# Patient Record
Sex: Male | Born: 1995
Health system: Southern US, Community
[De-identification: ages and names within clinical notes are randomized; demographics above are authoritative.]

## PROBLEM LIST (undated history)

## (undated) DIAGNOSIS — E669 Obesity, unspecified: Secondary | ICD-10-CM

---

## 2020-06-25 ENCOUNTER — Emergency Department (HOSPITAL_COMMUNITY): Payer: 59

## 2020-06-25 ENCOUNTER — Inpatient Hospital Stay (HOSPITAL_COMMUNITY)
Admission: EM | Admit: 2020-06-25 | Discharge: 2020-07-03 | DRG: 917 | Disposition: A | Discharge status: Home / Self Care | Payer: 59 | Attending: Family Medicine | Admitting: Family Medicine

## 2020-06-25 ENCOUNTER — Other Ambulatory Visit: Payer: Self-pay

## 2020-06-25 ENCOUNTER — Inpatient Hospital Stay (HOSPITAL_COMMUNITY): Payer: 59

## 2020-06-25 ENCOUNTER — Encounter (HOSPITAL_COMMUNITY): Payer: Self-pay

## 2020-06-25 DIAGNOSIS — M6282 Rhabdomyolysis: Secondary | ICD-10-CM | POA: Diagnosis not present

## 2020-06-25 DIAGNOSIS — F39 Unspecified mood [affective] disorder: Secondary | ICD-10-CM | POA: Diagnosis not present

## 2020-06-25 DIAGNOSIS — F319 Bipolar disorder, unspecified: Secondary | ICD-10-CM | POA: Diagnosis present

## 2020-06-25 DIAGNOSIS — R7989 Other specified abnormal findings of blood chemistry: Secondary | ICD-10-CM | POA: Diagnosis present

## 2020-06-25 DIAGNOSIS — F419 Anxiety disorder, unspecified: Secondary | ICD-10-CM | POA: Diagnosis present

## 2020-06-25 DIAGNOSIS — F12151 Cannabis abuse with psychotic disorder with hallucinations: Secondary | ICD-10-CM | POA: Diagnosis present

## 2020-06-25 DIAGNOSIS — R404 Transient alteration of awareness: Secondary | ICD-10-CM

## 2020-06-25 DIAGNOSIS — Z538 Procedure and treatment not carried out for other reasons: Secondary | ICD-10-CM | POA: Diagnosis not present

## 2020-06-25 DIAGNOSIS — D72829 Elevated white blood cell count, unspecified: Secondary | ICD-10-CM | POA: Diagnosis present

## 2020-06-25 DIAGNOSIS — Y929 Unspecified place or not applicable: Secondary | ICD-10-CM

## 2020-06-25 DIAGNOSIS — F12988 Cannabis use, unspecified with other cannabis-induced disorder: Secondary | ICD-10-CM | POA: Diagnosis present

## 2020-06-25 DIAGNOSIS — R000 Tachycardia, unspecified: Secondary | ICD-10-CM | POA: Diagnosis present

## 2020-06-25 DIAGNOSIS — T40711A Poisoning by cannabis, accidental (unintentional), initial encounter: Principal | ICD-10-CM | POA: Diagnosis present

## 2020-06-25 DIAGNOSIS — Z818 Family history of other mental and behavioral disorders: Secondary | ICD-10-CM

## 2020-06-25 DIAGNOSIS — R9431 Abnormal electrocardiogram [ECG] [EKG]: Secondary | ICD-10-CM | POA: Diagnosis present

## 2020-06-25 DIAGNOSIS — E860 Dehydration: Secondary | ICD-10-CM | POA: Diagnosis present

## 2020-06-25 DIAGNOSIS — F909 Attention-deficit hyperactivity disorder, unspecified type: Secondary | ICD-10-CM | POA: Diagnosis present

## 2020-06-25 DIAGNOSIS — E876 Hypokalemia: Secondary | ICD-10-CM | POA: Diagnosis present

## 2020-06-25 DIAGNOSIS — R4182 Altered mental status, unspecified: Secondary | ICD-10-CM | POA: Diagnosis not present

## 2020-06-25 DIAGNOSIS — E669 Obesity, unspecified: Secondary | ICD-10-CM | POA: Diagnosis present

## 2020-06-25 DIAGNOSIS — G928 Other toxic encephalopathy: Secondary | ICD-10-CM | POA: Diagnosis present

## 2020-06-25 DIAGNOSIS — Z6839 Body mass index (BMI) 39.0-39.9, adult: Secondary | ICD-10-CM | POA: Diagnosis not present

## 2020-06-25 DIAGNOSIS — U070 Vaping-related disorder: Secondary | ICD-10-CM | POA: Diagnosis present

## 2020-06-25 DIAGNOSIS — Z20822 Contact with and (suspected) exposure to covid-19: Secondary | ICD-10-CM | POA: Diagnosis present

## 2020-06-25 DIAGNOSIS — F1514 Other stimulant abuse with stimulant-induced mood disorder: Secondary | ICD-10-CM | POA: Diagnosis present

## 2020-06-25 DIAGNOSIS — G0490 Encephalitis and encephalomyelitis, unspecified: Secondary | ICD-10-CM | POA: Diagnosis not present

## 2020-06-25 DIAGNOSIS — R748 Abnormal levels of other serum enzymes: Secondary | ICD-10-CM | POA: Diagnosis present

## 2020-06-25 DIAGNOSIS — Z781 Physical restraint status: Secondary | ICD-10-CM | POA: Diagnosis not present

## 2020-06-25 DIAGNOSIS — A419 Sepsis, unspecified organism: Secondary | ICD-10-CM | POA: Insufficient documentation

## 2020-06-25 DIAGNOSIS — R410 Disorientation, unspecified: Secondary | ICD-10-CM | POA: Diagnosis not present

## 2020-06-25 HISTORY — DX: Obesity, unspecified: E66.9

## 2020-06-25 LAB — ACETAMINOPHEN LEVEL: Acetaminophen (Tylenol), Serum: <10 ug/mL — ABNORMAL LOW (ref 10–30)

## 2020-06-25 LAB — APTT: aPTT: 34 seconds (ref 24–36)

## 2020-06-25 LAB — BLOOD GAS, VENOUS
Acid-base deficit: 2.9 mmol/L — ABNORMAL HIGH (ref 0.0–2.0)
Bicarbonate: 21.3 mmol/L (ref 20.0–28.0)
Drawn by: 164
O2 Saturation: 96.5 %
Patient temperature: 37
pCO2, Ven: 36.2 mmHg — ABNORMAL LOW (ref 44.0–60.0)
pH, Ven: 7.387 (ref 7.250–7.430)
pO2, Ven: 88.5 mmHg — ABNORMAL HIGH (ref 32.0–45.0)

## 2020-06-25 LAB — TSH
TSH: 2.31 u[IU]/mL (ref 0.350–4.500)
TSH: 2.72 u[IU]/mL (ref 0.350–4.500)

## 2020-06-25 LAB — URINALYSIS, COMPLETE (UACMP) WITH MICROSCOPIC
Glucose, UA: NEGATIVE mg/dL
Ketones, ur: >80 mg/dL — AB
Leukocytes,Ua: NEGATIVE
Nitrite: NEGATIVE
Protein, ur: 100 mg/dL — AB
Specific Gravity, Urine: >1.03 — ABNORMAL HIGH (ref 1.005–1.030)
Squamous Epithelial / HPF: NONE SEEN (ref 0–5)
pH: 6 (ref 5.0–8.0)

## 2020-06-25 LAB — COMPREHENSIVE METABOLIC PANEL
ALT: 25 U/L (ref 0–44)
AST: 32 U/L (ref 15–41)
Albumin: 4.8 g/dL (ref 3.5–5.0)
Alkaline Phosphatase: 81 U/L (ref 38–126)
Anion gap: 17 — ABNORMAL HIGH (ref 5–15)
BUN: 10 mg/dL (ref 6–20)
CO2: 17 mmol/L — ABNORMAL LOW (ref 22–32)
Calcium: 9.5 mg/dL (ref 8.9–10.3)
Chloride: 102 mmol/L (ref 98–111)
Creatinine, Ser: 0.99 mg/dL (ref 0.61–1.24)
GFR, Estimated: >60 mL/min (ref >60)
Glucose, Bld: 88 mg/dL (ref 70–99)
Potassium: 3.3 mmol/L — ABNORMAL LOW (ref 3.5–5.1)
Sodium: 136 mmol/L (ref 135–145)
Total Bilirubin: 1.4 mg/dL — ABNORMAL HIGH (ref 0.3–1.2)
Total Protein: 8.6 g/dL — ABNORMAL HIGH (ref 6.5–8.1)

## 2020-06-25 LAB — I-STAT VENOUS BLOOD GAS, ED
Acid-base deficit: 3 mmol/L — ABNORMAL HIGH (ref 0.0–2.0)
Bicarbonate: 19.3 mmol/L — ABNORMAL LOW (ref 20.0–28.0)
Calcium, Ion: 1.07 mmol/L — ABNORMAL LOW (ref 1.15–1.40)
HCT: 48 % (ref 39.0–52.0)
Hemoglobin: 16.3 g/dL (ref 13.0–17.0)
O2 Saturation: 97 %
Potassium: 3.6 mmol/L (ref 3.5–5.1)
Sodium: 139 mmol/L (ref 135–145)
TCO2: 20 mmol/L — ABNORMAL LOW (ref 22–32)
pCO2, Ven: 28.3 mmHg — ABNORMAL LOW (ref 44.0–60.0)
pH, Ven: 7.443 — ABNORMAL HIGH (ref 7.250–7.430)
pO2, Ven: 89 mmHg — ABNORMAL HIGH (ref 32.0–45.0)

## 2020-06-25 LAB — PROTIME-INR
INR: 1.1 (ref 0.8–1.2)
Prothrombin Time: 13.7 seconds (ref 11.4–15.2)

## 2020-06-25 LAB — RAPID URINE DRUG SCREEN, HOSP PERFORMED
Amphetamines: NOT DETECTED
Barbiturates: NOT DETECTED
Benzodiazepines: NOT DETECTED
Cocaine: NOT DETECTED
Opiates: NOT DETECTED
Tetrahydrocannabinol: POSITIVE — AB

## 2020-06-25 LAB — BASIC METABOLIC PANEL
Anion gap: 9 (ref 5–15)
BUN: 8 mg/dL (ref 6–20)
CO2: 24 mmol/L (ref 22–32)
Calcium: 8.8 mg/dL — ABNORMAL LOW (ref 8.9–10.3)
Chloride: 104 mmol/L (ref 98–111)
Creatinine, Ser: 0.77 mg/dL (ref 0.61–1.24)
GFR, Estimated: >60 mL/min (ref >60)
Glucose, Bld: 104 mg/dL — ABNORMAL HIGH (ref 70–99)
Potassium: 3.3 mmol/L — ABNORMAL LOW (ref 3.5–5.1)
Sodium: 137 mmol/L (ref 135–145)

## 2020-06-25 LAB — RESP PANEL BY RT-PCR (FLU A&B, COVID) ARPGX2
Influenza A by PCR: NEGATIVE
Influenza B by PCR: NEGATIVE
SARS Coronavirus 2 by RT PCR: NEGATIVE

## 2020-06-25 LAB — CBC
HCT: 47.9 % (ref 39.0–52.0)
Hemoglobin: 16 g/dL (ref 13.0–17.0)
MCH: 29.1 pg (ref 26.0–34.0)
MCHC: 33.4 g/dL (ref 30.0–36.0)
MCV: 87.2 fL (ref 80.0–100.0)
Platelets: 399 10*3/uL (ref 150–400)
RBC: 5.49 MIL/uL (ref 4.22–5.81)
RDW: 12.2 % (ref 11.5–15.5)
WBC: 15.4 10*3/uL — ABNORMAL HIGH (ref 4.0–10.5)
nRBC: 0 % (ref 0.0–0.2)

## 2020-06-25 LAB — CK: Total CK: 1130 U/L — ABNORMAL HIGH (ref 49–397)

## 2020-06-25 LAB — ETHANOL
Alcohol, Ethyl (B): <10 mg/dL (ref <10)
Alcohol, Ethyl (B): <10 mg/dL (ref <10)

## 2020-06-25 LAB — SALICYLATE LEVEL: Salicylate Lvl: <7 mg/dL — ABNORMAL LOW (ref 7.0–30.0)

## 2020-06-25 LAB — LACTIC ACID, PLASMA
Lactic Acid, Venous: 2.1 mmol/L (ref 0.5–1.9)
Lactic Acid, Venous: 0.9 mmol/L (ref 0.5–1.9)

## 2020-06-25 LAB — AMMONIA: Ammonia: 20 umol/L (ref 9–35)

## 2020-06-25 LAB — CBG MONITORING, ED: Glucose-Capillary: 105 mg/dL — ABNORMAL HIGH (ref 70–99)

## 2020-06-25 LAB — HIV ANTIBODY (ROUTINE TESTING W REFLEX): HIV Screen 4th Generation wRfx: NONREACTIVE

## 2020-06-25 MED ORDER — LACTATED RINGERS IV BOLUS
500.0000 mL | Freq: Once | INTRAVENOUS | Status: AC
  Administered 2020-06-25: 500 mL via INTRAVENOUS

## 2020-06-25 MED ORDER — ACETAMINOPHEN 650 MG RE SUPP: 650.0000 mg | Freq: Four times a day (QID) | RECTAL | Status: DC | PRN

## 2020-06-25 MED ORDER — SODIUM CHLORIDE 0.9 % IV SOLN: INTRAVENOUS | Status: DC

## 2020-06-25 MED ORDER — LORAZEPAM 2 MG/ML IJ SOLN: 0.5000 mg | INTRAMUSCULAR | Status: DC | PRN

## 2020-06-25 MED ORDER — LACTATED RINGERS IV BOLUS
1000.0000 mL | Freq: Once | INTRAVENOUS | Status: AC
  Administered 2020-06-25: 1000 mL via INTRAVENOUS

## 2020-06-25 MED ORDER — MIDAZOLAM HCL 2 MG/2ML IJ SOLN
5.0000 mg | INTRAMUSCULAR | Status: DC | PRN
  Administered 2020-06-25: 5 mg via INTRAVENOUS
  Filled 2020-06-25: qty 6

## 2020-06-25 MED ORDER — MIDAZOLAM HCL 2 MG/2ML IJ SOLN: 2.0000 mg | INTRAMUSCULAR | Status: DC | PRN

## 2020-06-25 MED ORDER — ZIPRASIDONE MESYLATE 20 MG IM SOLR
20.0000 mg | Freq: Once | INTRAMUSCULAR | Status: AC
  Administered 2020-06-25: 20 mg via INTRAMUSCULAR
  Filled 2020-06-25: qty 20

## 2020-06-25 MED ORDER — ACETAMINOPHEN 325 MG PO TABS
650.0000 mg | ORAL_TABLET | Freq: Four times a day (QID) | ORAL | Status: DC | PRN
  Administered 2020-07-02: 650 mg via ORAL
  Filled 2020-06-25: qty 2

## 2020-06-25 MED ORDER — LORAZEPAM 2 MG/ML IJ SOLN
2.0000 mg | Freq: Once | INTRAMUSCULAR | Status: AC
  Administered 2020-06-25: 2 mg via INTRAMUSCULAR
  Filled 2020-06-25: qty 1

## 2020-06-25 MED ORDER — SODIUM CHLORIDE 0.9 % IV SOLN
2.0000 g | Freq: Two times a day (BID) | INTRAVENOUS | Status: DC
  Administered 2020-06-25 – 2020-06-26 (×2): 2 g via INTRAVENOUS
  Filled 2020-06-25: qty 20
  Filled 2020-06-25: qty 2
  Filled 2020-06-25: qty 20

## 2020-06-25 MED ORDER — ACETAMINOPHEN 500 MG PO TABS
1000.0000 mg | ORAL_TABLET | Freq: Once | ORAL | Status: AC
  Administered 2020-06-25: 1000 mg via ORAL
  Filled 2020-06-25: qty 2

## 2020-06-25 MED ORDER — DIPHENHYDRAMINE HCL 50 MG/ML IJ SOLN
25.0000 mg | Freq: Once | INTRAMUSCULAR | Status: AC
  Administered 2020-06-25: 25 mg via INTRAMUSCULAR
  Filled 2020-06-25: qty 1

## 2020-06-25 MED ORDER — LORAZEPAM 2 MG/ML IJ SOLN
1.0000 mg | INTRAMUSCULAR | Status: DC | PRN
  Administered 2020-06-26: 1 mg via INTRAVENOUS
  Filled 2020-06-25: qty 1

## 2020-06-25 MED ORDER — SODIUM CHLORIDE 0.9 % IV BOLUS: 1000.0000 mL | Freq: Once | INTRAVENOUS | Status: DC

## 2020-06-25 MED ORDER — VANCOMYCIN HCL 1250 MG/250ML IV SOLN
1250.0000 mg | Freq: Three times a day (TID) | INTRAVENOUS | Status: DC
  Administered 2020-06-25 – 2020-06-26 (×3): 1250 mg via INTRAVENOUS
  Filled 2020-06-25 (×5): qty 250

## 2020-06-25 MED ORDER — VANCOMYCIN HCL 2000 MG/400ML IV SOLN
2000.0000 mg | Freq: Once | INTRAVENOUS | Status: DC
  Filled 2020-06-25: qty 400

## 2020-06-25 MED ORDER — HALOPERIDOL LACTATE 5 MG/ML IJ SOLN
5.0000 mg | Freq: Once | INTRAMUSCULAR | Status: AC
  Administered 2020-06-25: 5 mg via INTRAMUSCULAR
  Filled 2020-06-25: qty 1

## 2020-06-25 MED ORDER — DEXTROSE 5 % IV SOLN
10.0000 mg/kg | Freq: Three times a day (TID) | INTRAVENOUS | Status: DC
  Administered 2020-06-25 – 2020-06-26 (×2): 800 mg via INTRAVENOUS
  Filled 2020-06-25 (×5): qty 16

## 2020-06-25 MED ORDER — LORAZEPAM 1 MG PO TABS
1.0000 mg | ORAL_TABLET | Freq: Once | ORAL | Status: AC
  Administered 2020-06-25: 1 mg via ORAL
  Filled 2020-06-25: qty 1

## 2020-06-25 MED ORDER — SODIUM CHLORIDE 0.9 % IV SOLN
2.0000 g | Freq: Once | INTRAVENOUS | Status: AC
  Administered 2020-06-25: 2 g via INTRAVENOUS
  Filled 2020-06-25: qty 20

## 2020-06-25 NOTE — ED Notes (Signed)
Report Olegario Messier, RN of [phone]

## 2020-06-25 NOTE — ED Notes (Signed)
Pt has to be redirected several times in order to follow commands.

## 2020-06-25 NOTE — Progress Notes (Signed)
Will attempt EEG after MRI

## 2020-06-25 NOTE — Progress Notes (Signed)
Interim progress note:  Paged by RN that parents were at bedside and would like an update. Dr. Melba Coon and I discussed the current labs/urine results and CT head.  Reported that they were already aware the LP was not able to be obtained.  Discussed proceeding with plan as outlined in H&P including MRI brain, EEG, antibiotics for now, neurology consultation, and psychiatry evaluation tomorrow.  Discussed broad differential at this time including for infection (less likely), drug-induced psychosis, vs organic initial manifestation of psychiatric illness with psychosis.  Dad does report that the patient's girlfriend stated he has not slept or ate much of anything in the past 3 days and was constantly working on his website.  Parents also very relieved that he almost was acting like his regular self when they are having a normal conversation with him earlier tonight (after receiving Geodon/Haldol/Ativan in ED).  Dad reports the only thing that was different than his regular was during conversation every once while he would seem like he heard something ("like an alarm or something") and would be agitated for a few minutes and then come back to a regular conversation.  He was able to remember some bits of earlier today and apologized for being violent.  Parents state he was hyper-focused on "solving the puzzle" of his current condition.  Patient was resting comfortably during the entirety of our conversation.  All questions were answered.  We will update parents overnight if any clinical change, otherwise mom reports she will be back here at 7 AM.  Allayne Stack, DO

## 2020-06-25 NOTE — Consult Note (Signed)
Neurology Consultation  Reason for Consult: Altered mental status  Referring Physician: Dr. Manson Passey  CC: Altered mental status  History is obtained from: Chart review, patient  HPI: Donald Curry is a 25 y.o. male with no pertinent medical history who presented to the ED today after he was -brought by his father for concerns of agitation and aggression. His girlfriend noticed last night that he was acting strangely and called Donald Curry father for help when he went to take the dog for a walk in the early morning hours and returned home without the dog. Donald Curry continued acting confused and aggressive for his father so he was brought to the ED for further evaluation. In the ED, he was verbally aggressive with tachycardia, low grade temperature, with tachypnea and an elevated venous lactic acid. EDP was concerned for infectious process as cause of presentation. His UDS was also positive for THC. He required Geodon, Haldol, Benadryl, Ativan, and Versed in the ED for aggression and agitation. He was taken for fluoro-guided LP which was unsuccessful due to patient movements and contaminating the sterile field.   On examination today patient has disorganized thoughts and pressured speech with mood lability. Some grandiosity is noted as well. For example he references "having no weaknesses" and being "the biggest and smallest college and went to school for clowning everyone". He claims he is in college but finished Electrical engineer college" and got his PhD online "in everything" and then when asked the specific subject, proceeds to list "mathematics, chemistry, biology, and physics", then states he wants to see his phone to be able to tell examiners the rest of the subjects he obtained a PhD in. Patient also begins to talk about believing that "someone is cheating", stating that the identity is someone named "father" and when asked if it was his father or someone named father he states "yeah, I guess my father". He  then goes on to state that his father is cheating on his mother and later states that his father "plays on his cheating device" while mimicking someone tapping away at a smartphone. Also endorses talking to "false prophets" today and describes them as wearing doctor's clothing and masks, but they were "lying to me" which he sensed because they did not give him good eye contact. He endorses using a drug called "Delta-10" that he got from "some place called [place]" because it "keeps you up, makes you feel smart, and you are on autopilot and already know what is going to happen". Intermittently becomes agitated stating that him being "stuck in the hospital" is "my father's fault" but also states that he feels like he is "stuck in [employer]" when it is "sometimes my fault". He frequently becomes agitated with some of the questions asked He claims that "there is a bug that needs to be rectified". He has paranoid thoughts as well claiming that he has heard others speak behind his back about "his slowed thoughts" and states that he has "been beat up" with "people talking behind my back". Also endorses visual hallucinations during assessment pointing to the ceiling while stating that he is seeing a bug running on the ceiling. States that he has a website that nobody is supposed to know about and he thinks it is connected to another website that he is "not even allowed to access": [email or web address].    ROS: Unable to obtain due to altered mental status.   History reviewed. No pertinent past medical history.  History reviewed. No pertinent family history.  Social History:   reports current alcohol use. No history on file for tobacco use and drug use.  THC first use 25 years old. Claims to use "Delta-10".   Medications  Current Facility-Administered Medications:  .  acetaminophen (TYLENOL) tablet 650 mg, 650 mg, Oral, Q6H PRN **OR** acetaminophen (TYLENOL) suppository 650 mg, 650 mg, Rectal, Q6H PRN,  Idalia Needle, Victoria J, DO .  LORazepam (ATIVAN) injection 0.5 mg, 0.5 mg, Intravenous, PRN, Paige, Victoria J, DO .  vancomycin (VANCOREADY) IVPB 1250 mg/250 mL, 1,250 mg, Intravenous, Q8H, Paige, Victoria J, DO, Last Rate: 166.7 mL/hr at 06/25/20 1451, 1,250 mg at 06/25/20 1451 .  vancomycin (VANCOREADY) IVPB 2000 mg/400 mL, 2,000 mg, Intravenous, Once, Paige, Victoria J, DO  Exam: Current vital signs: BP 110/60   Pulse 99   Temp 100 F (37.8 C) (Oral)   Resp 20   Ht 6\' 1"  (1.854 m)   Wt 135.1 kg   SpO2 96%   BMI 39.29 kg/m  Vital signs in last 24 hours: Temp:  [100 F (37.8 C)] 100 F (37.8 C) (03/25 0703) Pulse Rate:  [91-150] 99 (03/25 1400) Resp:  [14-29] 20 (03/25 1400) BP: (98-155)/(46-106) 110/60 (03/25 1400) SpO2:  [83 %-100 %] 96 % (03/25 1400) Weight:  [135.1 kg] 135.1 kg (03/25 1030)  GENERAL: Sleeping in bed initially, wakes to voice. Appears anxious and somewhat agitated.  Psych: speech is rapid and pressured, thought content is delusional, thought process is disorganized. Not currently suicidal. Endorses thoughts/belilefs consistent with paranoia, as well as delusions of grandeur. Speech with flight of ideas and tangentiality as well as word salad. Endorses seeing a bug that is not there when examiners look to where he is pointing. Denies auditory hallucinations. Mood is agitated and labile.  Head: Normocephalic and atraumatic without obvious deformity EENT: No OP obstruction LUNGS: Increased respiratory rate, non-labored breathing CV - tachycardic on presentation ABDOMEN - Soft, non-distended Ext: warm, without obvious deformity  NEURO:  Mental Status: Alert to self and year. When asked the month he states "I would say 4" then looks at the communication board in the room and then corrects himself to "month 3". Correctly identifies the state, but not the city. Speech is pressured and rapid but without aphasia or dysarthria. Sometimes perseverates. Naming, fluency, and  comprehension intact.  No neglect noted.  Cranial Nerves:  II: Pupils appear equal and round.  III, IV, VI: EOMI. Fixates and tracks examiner around room.  V: Unable to assess facial sensation to light touch due to patient agitation.  VII: Face is symmetric resting and smiling  VIII: Hearing intact to voice IX, X: Phonation normal. Swallows water and crackers without difficulty XI: UTA due to patient agitation XII: Does not protrude tongue to command Motor: Spontaneous antigravity movement noted in all extremities. Bulk is normal. Too agitated and emotionally labile to safely test limb strength against resistance. Sensation: UTA due to patient agitation Reflexes: UTA due to patient agitation Cerebellar: No gross ataxia noted with spontaneous movements  Gait- Deferred  Labs I have reviewed labs in epic and the results pertinent to this consultation are:  CBC    Component Value Date/Time   WBC 15.4 (H) 06/25/2020 0651   RBC 5.49 06/25/2020 0651   HGB 16.3 06/25/2020 0955   HCT 48.0 06/25/2020 0955   PLT 399 06/25/2020 0651   MCV 87.2 06/25/2020 0651   MCH 29.1 06/25/2020 0651   MCHC 33.4 06/25/2020 0651   RDW 12.2 06/25/2020 0651   CMP  Component Value Date/Time   NA 139 06/25/2020 0955   K 3.6 06/25/2020 0955   CL 102 06/25/2020 0651   CO2 17 (L) 06/25/2020 0651   GLUCOSE 88 06/25/2020 0651   BUN 10 06/25/2020 0651   CREATININE 0.99 06/25/2020 0651   CALCIUM 9.5 06/25/2020 0651   PROT 8.6 (H) 06/25/2020 0651   ALBUMIN 4.8 06/25/2020 0651   AST 32 06/25/2020 0651   ALT 25 06/25/2020 0651   ALKPHOS 81 06/25/2020 0651   BILITOT 1.4 (H) 06/25/2020 0651   GFRNONAA >60 06/25/2020 5621   Imaging I have reviewed the images obtained:  CT-scan of the brain: IMPRESSION: No acute intracranial findings.  MRI examination of the brain ordered, pending  Assessment: 25 year old male presenting with acute alteration in mental status with agitation, flight of ideas,  paranoia, disorganized thoughts, and evidence for visual hallucinations and delusions. UDS positive for THC and also endorses use yesterday of a psychoactive substance named "Delta-10" which can apparently be bought over the counter at a gas station in [facility]. - Examination reveals patient without focal neurological deficits but with pressured speech, agitation, mood lability, flight of ideas, word salad, disorganized thinking, hallucinations and delusions despite multiple agents used for agitation in the ED today: Benadryl, Versed, Ativan, Haldol, and Geodon. - EDP concern for possible infectious cause of altered mental status due to tachycardia, tachypnea, and elevated venous lactate. Based on assessment by Neurology team, these symptoms are felt more likely to be due to serotonin syndrome.  - Unable to obtain fluoro-guided LP due to patient agitation and contamination of sterile field. - Patient's reported family history of psychiatric illness places him at increased risk for AMS secondary to organic psychiatric disorder. Per review, mother with a history of altered mental status, confusion, and agitation after reported lack of sleep by patient's father. Patient's father believes this is a similar presentation to his mother's presentation in the past. - DDx includes THC-induced psychosis versus serotonin syndrome, versus psychosis due to use of OTC psychoactive "Delta-10", versus bipolar disorder- manic episode with psychotic features, versus unspecified schizophrenia spectrum disorder. Low suspicion for meningitis, encephalitis, or other infectious causes at this time due to overall patient presentation and exam findings. No complaints of neck pain or stiffness on examination.  - Head CT without acute abnormality.   Recommendations: - Recommend clinical staff to visit the patient's personally designed website [email or web address] - Psychiatric evaluation - MRI brain when able to obtain  - No need  for LP at this time or continued empiric meningitis coverage- low suspicion for infectious etiology at this time - Sedating medications per Psychiatry - 10-panel drug screen to complement the basic panel already obtained  Lanae Boast, AGAC-NP [facility] Pager: [phone]  I have seen and examined the patient. I have reviewed and amended the assessment and recommendations. 25 year old male presenting with acute alteration in mental status with agitation, flight of ideas, paranoia, disorganized thoughts, and evidence for visual hallucinations and delusions. UDS positive for THC and also endorses use yesterday of a psychoactive substance named "Delta-10" which can apparently be bought over the counter at a gas station in [facility]. Exam findings most consistent with acute psychosis. Recommendations as above.  Electronically signed: Dr. Caryl Pina

## 2020-06-25 NOTE — ED Triage Notes (Signed)
Brought in accompanied by father - symptoms started yesterday - per father - pt is not making any sense, saying incoherent statements.   Upon arrival of pt in triage, pt unable to follow directions, agitated. Pt keeps on having flight of ideas and showing bursts of energy.   Unable to check vital signs on pt as he took bp cuff off. Unable to get further info from pt.

## 2020-06-25 NOTE — Consult Note (Signed)
Fluoro-guided LP requested. Consent obtained from patient's father via phone. Patient received 5mg  verced prior to procedure. Time out performed.  Unfortunately, procedure could not be performed due to inability of patient to remain still. Patient contaminated sterile field during attempted sterilization and moved multiple times after site marking with fluoro.  Procedure was aborted.  This was discussed with the referring clinican in the ED, PA.  -Allena Katz, MD

## 2020-06-25 NOTE — Progress Notes (Signed)
Pharmacy Antibiotic Note  Donald Curry is a 25 y.o. male admitted on 06/25/2020 with suspicion for meningitis. Pt with no psychiatric history presents with AMS and is febrile/tachycardic. Lactic acid 2.1, WBC 15.4. Given presentation, pt undergoing infectious workup including meningitis. Pharmacy has been consulted for vancomycin dosing.  Plan: Vancomycin 2000 mg loading dose x1 Vancomycin 1250 IV every 8 hours. Goal trough 15-20 mcg/mL. Trough levels at steady state. CTX per MD Follow up cultures, clinical progression, and plan of treatment.   Height: 6\' 1"  (185.4 cm) Weight: 135.1 kg (297 lb 12.8 oz) IBW/kg (Calculated) : 79.9  Temp (24hrs), Avg:100 F (37.8 C), Min:100 F (37.8 C), Max:100 F (37.8 C)  Recent Labs  Lab 06/25/20 0651 06/25/20 0945  WBC 15.4*  --   CREATININE 0.99  --   LATICACIDVEN  --  2.1*    Estimated Creatinine Clearance: 168.9 mL/min (by C-G formula based on SCr of 0.99 mg/dL).    No Known Allergies  Antimicrobials this admission: Vancomycin 3/25 >>  Ceftriaxone 3/25 >>   Dose adjustments this admission: N/A  Microbiology results: 3/25 BCx x2: pending 3/25 UCx: pending  3/25 CSF Culture: pending 3/25 RT-PCR: pending  Thank you for allowing pharmacy to be a part of this patient's care.  4/25 PharmD Candidate 2022 06/25/2020 11:30 AM

## 2020-06-25 NOTE — ED Notes (Signed)
Pt to IR at this time on transport monitor. SWOT RN to assist IR with LP.

## 2020-06-25 NOTE — H&P (Addendum)
Family Medicine Teaching [facility] Admission History and Physical Service Pager: [phone]  Patient name: Donald Curry Medical record number: [id number] Date of birth: May 22, 1995 Age: 25 y.o. Gender: male  Primary Care Provider: Patient, No Pcp Per Consultants: IR, Neuro Code Status: FULL Preferred Emergency Contact: Carols Clemence (father) [phone]  Chief Complaint: altered mental status  Assessment and Plan: Donald Curry is a 25 y.o. male presenting with altered mental status . PMH is significant for ADHD.   Altered mental status  Acute Encephalopathy  c/f Sepsis  Patient presented to ED with new onset confusion and agitation. He was tachycardic to 150, temp of 100, tachypnic to 27. On physical exam alert to name, but refused to answer other questions. Neuro exam wnl. Differential broad at this time and includes sepsis, encephalitis, meningitis, drug or alcohol intoxication, psychogenic psychosis, THC induced psychosis, seizure. At this time there is no clear source of infection, though patient meets SIRS criteria with tachycardia, tachypnea, and elevated WBC of 15 although WBC could potentially be due from demargination/stress reaction. LA elevated to 2.1. UA negative for infection. LP done in the ED and will await results to check for potential CNS infection. MRI ordered. Started patient on Ceftriaxone and Vancomycin. Obtaining an ethanol level to check for alcohol intoxication, CIWAs in place. We can not rule out epileptic seizure tho less likely but obtaining EEG.  With regards to drug intoxication, UDS was positive for THC which could cause Cannabis induced psychosis. THC could also potentially be laced with a drug such as PCP that is not detected on UDS. Could also potentially be new onset schizophrenia, as this is the age when men are most likely to present with it, and on exam he had delusions and disorganized thoughts. Also considered SS and NMS. Ruled out uremia due to  normal kidney function, and also ruled out trauma and tumor with negative CT head. We are most likely considering psychogenic psychosis, drug intoxication, or new onset schizophrenia, though we are still pending additional work up.  - admit patient to FPTS, attending Dr. Manson Passey - Consulted IR and neuro, appreciate recommendations  - consult psych  - vitals per floor - continuous pulse ox  - cardiac monitoring  - Con't Vanc and Ctx at meningitic dosing  - start valacyclovir also at meningitic dosing after LP - con't mIVF   - f/u urine culture, blood culture  - f/u MRI - f/u CSF cell count - f/u TSH, CK, HSV, HIV - f/u am BMP, CBC - f/u CIWA - call girlfriend about potential substances used   THC Use  UDS positive for THC. Unable to obtain from patient how much he uses - encourage cessation     ADHD Per chart review. No medications on chart.   FEN/GI: NPO pending bedside swallow eval Prophylaxis: SCDs   Disposition: Progressive  History of Present Illness:  Donald Curry is a 25 y.o. male presenting with confusion and agitation. Information obtained from ED notes, as patient was not answering our questions. Patient is somnolent, arouses to voice, oriented to self only. Did not answer which hospital he was in or what year it was, stating those were dumb questions. He proceeded to talk about how much he misses Baird Lyons (girlfriend).   Yesterday night patient started acting strange per father and girlfriend, acting confused and being aggressive.  He went to walk the dog early this morning and came home without the dog. Denies any flight of ideas or burst of energy.  Father states  he was in normal state of health the day before.  Father denies drug or alcohol use or psychiatric history.  He believes this is from lack of sleep over the past couple of days, and states this happened to his wife a couple of years ago after not getting much sleep.  Father denies new medications that he is aware  of, and patient denies taking any medications. No history of this happening previously. Denies recent infection, fever, does endorse a cough and proceeded to cough tho he may have done that as a joke. Was unable to answer any other review of systems questions.    ED course:  In the ED patient was noted to be fidgeting, hyperactive, getting out of bed, taking out his IV, and being verbally aggressive. He was tachycardic to 150, temp of 100, tachynic to 27. Administered Geodon so that the ED could obtain labs. Also received Ativan x2 and Haldol x1. LA 2.1, WBC 15. UA with mod bili, trace hgb, >80 ketones, 100 protein, SG > 1.030. Negative for infection. CXR and CT head were negative . 30cc/kg (ideal body weight) of fluids initiated. LP being done under fluoroscopy. Vanc and CTX started.     Review Of Systems: Per HPI with the following additions:   Review of Systems  Constitutional: Positive for chills. Negative for fever.  Respiratory: Negative for cough and shortness of breath.   Cardiovascular: Negative for chest pain.  Gastrointestinal: Negative for constipation, diarrhea, nausea and vomiting.  Skin: Negative for rash and wound.  Psychiatric/Behavioral: Positive for agitation, behavioral problems, confusion and decreased concentration.  All other systems reviewed and are negative.    There are no problems to display for this patient.   Past Medical History: History reviewed. No pertinent past medical history.  Past Surgical History: History reviewed. No pertinent surgical history.  Social History: Social History   Substance Use Topics  . Alcohol use: Yes   Additional social history:  Please also refer to relevant sections of EMR.  Family History: History reviewed. No pertinent family history.  Allergies and Medications: No Known Allergies No current facility-administered medications on file prior to encounter.   No current outpatient medications on file prior to  encounter.    Objective: BP 123/75   Pulse 94   Temp 100 F (37.8 C) (Oral)   Resp (!) 21   Ht 6\' 1"  (1.854 m)   Wt 135.1 kg   SpO2 95%   BMI 39.29 kg/m  Exam: General: sleep but arousable to voice, NAD Eyes: EOMI, PERL. Normal conjunctiva  ENTM: MMM, Oropharynx without erythema or lesions Neck: supple, normal ROM Cardiovascular: RRR no murmurs  Respiratory: CTAB normal WOB Gastrointestinal: soft, non-distended, non-tender  MSK: normal tone and strength in upper and lower extremities bilaterally   Derm: skin warm and dry without visible lesions  Neuro: Alert to name. Refuses other A&O questions. CN2-12 in tact  Psych: mood is anxious and affect is inappropriate. Makes jokes of questions asked and refuses other questions. Easily agitated.   Labs and Imaging: CBC BMET  Recent Labs  Lab 06/25/20 0651 06/25/20 0955  WBC 15.4*  --   HGB 16.0 16.3  HCT 47.9 48.0  PLT 399  --    Recent Labs  Lab 06/25/20 0651 06/25/20 0955  NA 136 139  K 3.3* 3.6  CL 102  --   CO2 17*  --   BUN 10  --   CREATININE 0.99  --   GLUCOSE 88  --  CALCIUM 9.5  --      [id number]:WRUEA tachycardia   CT HEAD WO CONTRAST  Result Date: 06/25/2020 CLINICAL DATA:  Delirium EXAM: CT HEAD WITHOUT CONTRAST TECHNIQUE: Contiguous axial images were obtained from the base of the skull through the vertex without intravenous contrast. COMPARISON:  None. FINDINGS: Brain: No evidence of acute infarction, hemorrhage, hydrocephalus, extra-axial collection or mass lesion/mass effect. Vascular: No hyperdense vessel or unexpected calcification. Skull: Normal. Negative for fracture or focal lesion. Sinuses/Orbits: No acute finding. Other: None. IMPRESSION: No acute intracranial findings. Electronically Signed   By: Duanne Guess D.O.   On: 06/25/2020 11:36   DG Chest Port 1 View  Result Date: 06/25/2020 CLINICAL DATA:  Confusion and fever EXAM: PORTABLE CHEST 1 VIEW COMPARISON:  None. FINDINGS: Normal heart size  and mediastinal contours. No acute infiltrate or edema. No effusion or pneumothorax. No acute osseous findings. IMPRESSION: Negative for pneumonia. Electronically Signed   By: Marnee Spring M.D.   On: 06/25/2020 07:45    Cora Collum, DO 06/25/2020, 12:33 PM PGY-1, [facility] FPTS Intern pager: [phone], text pages welcome

## 2020-06-25 NOTE — ED Provider Notes (Signed)
[facility] EMERGENCY DEPARTMENT Provider Note   CSN: [id number] Arrival date & time: 06/25/20  9935     History Chief Complaint  Patient presents with  . Psychiatric Evaluation    Donald Curry is a 25 y.o. male with no pertinent past medical history that presents the emergency department today with father for altered mental status. Father states that yesterday night he started acting strange, was with his girlfriend.  Girlfriend called father in the morning when his girlfriend said that he came home while walking the dog without the dog.  He was not concerned that he did not have the dog.  Girlfriend called father who came to pick him up around 3 AM this morning, father states that he was acting strange, acting confused and being aggressive.  Father does not note any flight of ideas, however triage note states that patient had flight of ideas with showing burst of energy.  Patient is unable to answer any my questions, keeps asking me to take off my glasses because they look bad and has any if he could press the emergency button outside the door.  Father states that he was in his normal state of health the day before.  Denies any drug or alcohol use.  Father states that he does not have any psychiatric history.  States that he thinks this is all stemming from lack of sleep for the past couple of days, states that his wife a couple years ago had a similar instance where she did not get much sleep and started acting bizarre.  No other psychiatric family history.  No new medications that father is aware of.  States that patient is been with his girlfriend for multiple years, no stressors in his life that he is aware of.  Patient is a Database administrator.  He states that he is never acted like this before.  He denies any fevers, sick contacts, cough, URI symptoms, diarrhea or vomiting that he is aware of.   Patient is level 5 caveat due to altered mental  status.  HPI     History reviewed. No pertinent past medical history.  There are no problems to display for this patient.   History reviewed. No pertinent surgical history.     History reviewed. No pertinent family history.  Social History   Substance Use Topics  . Alcohol use: Yes    Home Medications Prior to Admission medications   Not on File    Allergies    Patient has no known allergies.  Review of Systems   Review of Systems  Unable to perform ROS: Mental status change    Physical Exam Updated Vital Signs BP (!) 98/48   Pulse 97   Temp 100 F (37.8 C) (Oral)   Resp 20   Ht 6\' 1"  (1.854 m)   Wt 135.1 kg   SpO2 96%   BMI 39.29 kg/m   Physical Exam Constitutional:      Appearance: Normal appearance. He is not ill-appearing, toxic-appearing or diaphoretic.     Comments: Patient is sitting in bed, unsettled.  Keeps fidgeting and getting out of bed.  Will not follow commands.  Will answer questions, however will respond inappropriately.  HENT:     Mouth/Throat:     Mouth: Mucous membranes are moist.     Pharynx: Oropharynx is clear.  Eyes:     General: No scleral icterus.    Extraocular Movements: Extraocular movements intact.  Pupils: Pupils are equal, round, and reactive to light.  Neck:     Comments: No meningismus Cardiovascular:     Rate and Rhythm: Regular rhythm. Tachycardia present.     Pulses: Normal pulses.     Heart sounds: Normal heart sounds.  Pulmonary:     Effort: Pulmonary effort is normal. No respiratory distress.     Breath sounds: Normal breath sounds. No stridor. No wheezing, rhonchi or rales.  Chest:     Chest wall: No tenderness.  Abdominal:     General: Abdomen is flat. There is no distension.     Palpations: Abdomen is soft.     Tenderness: There is no abdominal tenderness. There is no guarding or rebound.  Musculoskeletal:        General: No swelling or tenderness. Normal range of motion.     Cervical back:  Normal range of motion and neck supple. No rigidity.     Right lower leg: No edema.     Left lower leg: No edema.  Skin:    General: Skin is warm and dry.     Capillary Refill: Capillary refill takes less than 2 seconds.     Coloration: Skin is not pale.  Neurological:     Mental Status: He is alert.     Comments: Moving all 4 extremities.  No facial droop.  Is unable to answer any questions appropriately, when I asked him alert and oriented questions, he always responds " that is a funny question."  Normal gait.   Psychiatric:        Attention and Perception: He is inattentive.        Mood and Affect: Mood is anxious. Affect is inappropriate.        Speech: Speech is tangential.        Behavior: Behavior is agitated and hyperactive.     Comments: Patient is inattentive with inappropriate affect.  Appears agitated and hyperactive, no outward signs of homicidal or suicidal thought.  Speech is not slurred or rapid.  Appears slightly tangential.  Is not responding to internal stimuli.     ED Results / Procedures / Treatments   Labs (all labs ordered are listed, but only abnormal results are displayed) Labs Reviewed  COMPREHENSIVE METABOLIC PANEL - Abnormal; Notable for the following components:      Result Value   Potassium 3.3 (*)    CO2 17 (*)    Total Protein 8.6 (*)    Total Bilirubin 1.4 (*)    Anion gap 17 (*)    All other components within normal limits  CBC - Abnormal; Notable for the following components:   WBC 15.4 (*)    All other components within normal limits  SALICYLATE LEVEL - Abnormal; Notable for the following components:   Salicylate Lvl <7.0 (*)    All other components within normal limits  ACETAMINOPHEN LEVEL - Abnormal; Notable for the following components:   Acetaminophen (Tylenol), Serum <10 (*)    All other components within normal limits  LACTIC ACID, PLASMA - Abnormal; Notable for the following components:   Lactic Acid, Venous 2.1 (*)    All other  components within normal limits  CBG MONITORING, ED - Abnormal; Notable for the following components:   Glucose-Capillary 105 (*)    All other components within normal limits  I-STAT VENOUS BLOOD GAS, ED - Abnormal; Notable for the following components:   pH, Ven 7.443 (*)    pCO2, Ven 28.3 (*)  pO2, Ven 89.0 (*)    Bicarbonate 19.3 (*)    TCO2 20 (*)    Acid-base deficit 3.0 (*)    Calcium, Ion 1.07 (*)    All other components within normal limits  RESP PANEL BY RT-PCR (FLU A&B, COVID) ARPGX2  URINE CULTURE  CSF CULTURE W GRAM STAIN  CULTURE, BLOOD (ROUTINE X 2)  CULTURE, BLOOD (ROUTINE X 2)  AMMONIA  ETHANOL  TSH  PROTIME-INR  APTT  URINALYSIS, COMPLETE (UACMP) WITH MICROSCOPIC  RAPID URINE DRUG SCREEN, HOSP PERFORMED  BLOOD GAS, VENOUS  LACTIC ACID, PLASMA  CSF CELL COUNT WITH DIFFERENTIAL  PROTEIN AND GLUCOSE, CSF  VARICELLA-ZOSTER BY PCR  HERPES SIMPLEX VIRUS(HSV) DNA BY PCR  RPR    EKG None  Radiology CT HEAD WO CONTRAST  Result Date: 06/25/2020 CLINICAL DATA:  Delirium EXAM: CT HEAD WITHOUT CONTRAST TECHNIQUE: Contiguous axial images were obtained from the base of the skull through the vertex without intravenous contrast. COMPARISON:  None. FINDINGS: Brain: No evidence of acute infarction, hemorrhage, hydrocephalus, extra-axial collection or mass lesion/mass effect. Vascular: No hyperdense vessel or unexpected calcification. Skull: Normal. Negative for fracture or focal lesion. Sinuses/Orbits: No acute finding. Other: None. IMPRESSION: No acute intracranial findings. Electronically Signed   By: Duanne Guess D.O.   On: 06/25/2020 11:36   DG Chest Port 1 View  Result Date: 06/25/2020 CLINICAL DATA:  Confusion and fever EXAM: PORTABLE CHEST 1 VIEW COMPARISON:  None. FINDINGS: Normal heart size and mediastinal contours. No acute infiltrate or edema. No effusion or pneumothorax. No acute osseous findings. IMPRESSION: Negative for pneumonia. Electronically Signed    By: Marnee Spring M.D.   On: 06/25/2020 07:45    Procedures .Critical Care Performed by: Farrel Gordon, PA-C Authorized by: Farrel Gordon, PA-C   Critical care provider statement:    Critical care time (minutes):  45   Critical care was time spent personally by me on the following activities:  Discussions with consultants, evaluation of patient's response to treatment, examination of patient, ordering and performing treatments and interventions, ordering and review of laboratory studies, ordering and review of radiographic studies, pulse oximetry, re-evaluation of patient's condition, obtaining history from patient or surrogate and review of old charts     Medications Ordered in ED Medications  cefTRIAXone (ROCEPHIN) 2 g in sodium chloride 0.9 % 100 mL IVPB (has no administration in time range)  vancomycin (VANCOREADY) IVPB 2000 mg/400 mL (has no administration in time range)  lactated ringers bolus 1,000 mL (has no administration in time range)  vancomycin (VANCOREADY) IVPB 1250 mg/250 mL (has no administration in time range)  midazolam (VERSED) injection 2 mg (has no administration in time range)  lactated ringers bolus 500 mL (has no administration in time range)  acetaminophen (TYLENOL) tablet 1,000 mg (1,000 mg Oral Given 06/25/20 0713)  LORazepam (ATIVAN) tablet 1 mg (1 mg Oral Given 06/25/20 0713)  lactated ringers bolus 1,000 mL (0 mLs Intravenous Stopped 06/25/20 1030)  ziprasidone (GEODON) injection 20 mg (20 mg Intramuscular Given 06/25/20 0822)  diphenhydrAMINE (BENADRYL) injection 25 mg (25 mg Intramuscular Given 06/25/20 0928)  haloperidol lactate (HALDOL) injection 5 mg (5 mg Intramuscular Given 06/25/20 0928)  LORazepam (ATIVAN) injection 2 mg (2 mg Intramuscular Given 06/25/20 0920)    ED Course  I have reviewed the triage vital signs and the nursing notes.  Pertinent labs & imaging results that were available during my care of the patient were reviewed by me and  considered in my medical decision making (  see chart for details).    MDM Rules/Calculators/A&P                         Donald Curry is a 25 y.o. male with no pertinent past medical history that presents the emergency department today with father for altered mental status.  Patient has no psychiatric history, with temperature of 100 and pulse 123, I think that this is more than psychiatric illness at this time.  Will obtain basic work-up and sepsis order set.  No outward signs of infection or source.  No meningismus, however think patient will need LP.  800 was notified by nursing that patient started acting aggressive, started swearing and walking out of the room.  Patient had to be held down by staff including security, attempting to combat security.  Will give Geodon at this time so we can obtain basic labs, spoke to father about this who agrees.  Patient will also need to be under IVC due to aggressive behavior towards staff.   907 an hour after Geodon given, patient is still verbally aggressive, not cooperating and not allowing labs to be drawn at this time.  Spoke to Dr. Myrtis SerKatz about B-52.  I think this is reasonable at this time since patient is still aggressive,ripping out IVS, walking out of the room, unable to obtain labs.  Will place on continuous pulse ox monitoring and capnography.  959  Upon reevaluation, patient appears more calm and cooperative.  Nursing was able to obtain blood work, no need for restraints at this time. Still tachycardic.  1110 CT head interpreted by me without any acute findings, I think this is most likely encephalitis.  Patient meets septic criteria, will initiate code sepsis at this time for encephalitis.  Lactic acid came back at 2.1, white count came back at 15.  30 cc/kg of fluid initiated for ideal body weight.  Patient does not have any rigidity on neck, will start empiric antibiotics at this time.  Spoke to pharmacy Jonny RuizJohn who does not recommend acyclovir or  steroids at this time.  Patient will need a LP, however patient has a large body habitus, will most likely need this done under fluoroscopy.  Patient to be admitted at this time.   Dr. Myrtis SerKatz spoke to family medicine resident who will admit the patient.  Pt awaiting LP via IR. Will giver Versed prior to this.  Did receive call from IR stating that they were unable to do LP due to patient inability to remain still, tried to contact Dr. Manson PasseyBrown without success, did message resident who started H&P, Dr. Idalia NeedlePaige.   The patient appears reasonably stabilized for admission considering the current resources, flow, and capabilities available in the ED at this time, and I doubt any other [facility] requiring further screening and/or treatment in the ED prior to admission.  I discussed this case with my attending physician who cosigned this note including patient's presenting symptoms, physical exam, and planned diagnostics and interventions. Attending physician stated agreement with plan or made changes to plan which were implemented.   Attending physician assessed patient at bedside.   Final Clinical Impression(s) / ED Diagnoses Final diagnoses:  Altered mental status, unspecified altered mental status type  Encephalitis    Rx / DC Orders ED Discharge Orders    None       Farrel Gordonatel, Shalyn, PA-C 06/25/20 1533    Sabino DonovanKatz, Eric C, MD 06/28/20 1451

## 2020-06-25 NOTE — ED Notes (Signed)
Charge RN aware of need for a room for this pt.

## 2020-06-25 NOTE — ED Notes (Signed)
Pt fling off his mask and threw mask at father. Pt put up his middle finger directly towards father.

## 2020-06-25 NOTE — Progress Notes (Signed)
Elink following Code Sepsis. 

## 2020-06-26 ENCOUNTER — Encounter (HOSPITAL_COMMUNITY): Payer: Self-pay | Admitting: Family Medicine

## 2020-06-26 ENCOUNTER — Inpatient Hospital Stay (HOSPITAL_COMMUNITY): Payer: 59

## 2020-06-26 DIAGNOSIS — F12988 Cannabis use, unspecified with other cannabis-induced disorder: Secondary | ICD-10-CM | POA: Diagnosis not present

## 2020-06-26 DIAGNOSIS — F39 Unspecified mood [affective] disorder: Secondary | ICD-10-CM | POA: Diagnosis not present

## 2020-06-26 DIAGNOSIS — R4182 Altered mental status, unspecified: Secondary | ICD-10-CM | POA: Diagnosis not present

## 2020-06-26 LAB — BLOOD CULTURE ID PANEL (REFLEXED) - BCID2

## 2020-06-26 LAB — CBC WITH DIFFERENTIAL/PLATELET
Abs Immature Granulocytes: 0.03 10*3/uL (ref 0.00–0.07)
Basophils Absolute: 0 10*3/uL (ref 0.0–0.1)
Basophils Relative: 0 %
Eosinophils Absolute: 0.1 10*3/uL (ref 0.0–0.5)
Eosinophils Relative: 1 %
HCT: 39.5 % (ref 39.0–52.0)
Hemoglobin: 14.3 g/dL (ref 13.0–17.0)
Immature Granulocytes: 0 %
Lymphocytes Relative: 32 %
Lymphs Abs: 3.4 10*3/uL (ref 0.7–4.0)
MCH: 30.1 pg (ref 26.0–34.0)
MCHC: 36.2 g/dL — ABNORMAL HIGH (ref 30.0–36.0)
MCV: 83.2 fL (ref 80.0–100.0)
Monocytes Absolute: 1.1 10*3/uL — ABNORMAL HIGH (ref 0.1–1.0)
Monocytes Relative: 10 %
Neutro Abs: 6 10*3/uL (ref 1.7–7.7)
Neutrophils Relative %: 57 %
Platelets: 283 10*3/uL (ref 150–400)
RBC: 4.75 MIL/uL (ref 4.22–5.81)
RDW: 12.2 % (ref 11.5–15.5)
WBC: 10.6 10*3/uL — ABNORMAL HIGH (ref 4.0–10.5)
nRBC: 0 % (ref 0.0–0.2)

## 2020-06-26 LAB — COMPREHENSIVE METABOLIC PANEL
ALT: 27 U/L (ref 0–44)
AST: 39 U/L (ref 15–41)
Albumin: 3.9 g/dL (ref 3.5–5.0)
Alkaline Phosphatase: 64 U/L (ref 38–126)
Anion gap: 11 (ref 5–15)
BUN: 7 mg/dL (ref 6–20)
CO2: 20 mmol/L — ABNORMAL LOW (ref 22–32)
Calcium: 8.6 mg/dL — ABNORMAL LOW (ref 8.9–10.3)
Chloride: 105 mmol/L (ref 98–111)
Creatinine, Ser: 0.78 mg/dL (ref 0.61–1.24)
GFR, Estimated: >60 mL/min (ref >60)
Glucose, Bld: 96 mg/dL (ref 70–99)
Potassium: 3.2 mmol/L — ABNORMAL LOW (ref 3.5–5.1)
Sodium: 136 mmol/L (ref 135–145)
Total Bilirubin: 1.4 mg/dL — ABNORMAL HIGH (ref 0.3–1.2)
Total Protein: 7 g/dL (ref 6.5–8.1)

## 2020-06-26 LAB — RPR: RPR Ser Ql: NONREACTIVE

## 2020-06-26 LAB — BASIC METABOLIC PANEL
Anion gap: 13 (ref 5–15)
BUN: <5 mg/dL — ABNORMAL LOW (ref 6–20)
CO2: 20 mmol/L — ABNORMAL LOW (ref 22–32)
Calcium: 9.1 mg/dL (ref 8.9–10.3)
Chloride: 104 mmol/L (ref 98–111)
Creatinine, Ser: 0.76 mg/dL (ref 0.61–1.24)
GFR, Estimated: >60 mL/min (ref >60)
Glucose, Bld: 88 mg/dL (ref 70–99)
Potassium: 3.5 mmol/L (ref 3.5–5.1)
Sodium: 137 mmol/L (ref 135–145)

## 2020-06-26 LAB — CK
Total CK: 1446 U/L — ABNORMAL HIGH (ref 49–397)
Total CK: 1471 U/L — ABNORMAL HIGH (ref 49–397)

## 2020-06-26 LAB — URINE CULTURE: Culture: NO GROWTH

## 2020-06-26 LAB — BILIRUBIN, FRACTIONATED(TOT/DIR/INDIR)
Bilirubin, Direct: 0.2 mg/dL (ref 0.0–0.2)
Indirect Bilirubin: 1.1 mg/dL — ABNORMAL HIGH (ref 0.3–0.9)
Total Bilirubin: 1.3 mg/dL — ABNORMAL HIGH (ref 0.3–1.2)

## 2020-06-26 MED ORDER — HALOPERIDOL 0.5 MG PO TABS: 2.0000 mg | ORAL_TABLET | Freq: Every day | ORAL | Status: DC

## 2020-06-26 MED ORDER — ENOXAPARIN SODIUM 40 MG/0.4ML ~~LOC~~ SOLN
40.0000 mg | SUBCUTANEOUS | Status: DC
  Administered 2020-06-26 – 2020-06-27 (×2): 40 mg via SUBCUTANEOUS
  Filled 2020-06-26 (×2): qty 0.4

## 2020-06-26 MED ORDER — DIPHENHYDRAMINE HCL 50 MG/ML IJ SOLN: 50.0000 mg | Freq: Once | INTRAMUSCULAR | Status: DC | PRN

## 2020-06-26 MED ORDER — GADOBUTROL 1 MMOL/ML IV SOLN
10.0000 mL | Freq: Once | INTRAVENOUS | Status: AC | PRN
  Administered 2020-06-26: 10 mL via INTRAVENOUS

## 2020-06-26 MED ORDER — LORAZEPAM 2 MG/ML IJ SOLN
2.0000 mg | INTRAMUSCULAR | Status: DC | PRN
  Administered 2020-06-28: 2 mg via INTRAVENOUS
  Filled 2020-06-26: qty 1

## 2020-06-26 MED ORDER — HALOPERIDOL 0.5 MG PO TABS
2.0000 mg | ORAL_TABLET | Freq: Once | ORAL | Status: AC | PRN
  Filled 2020-06-26 (×2): qty 4

## 2020-06-26 MED ORDER — HALOPERIDOL LACTATE 5 MG/ML IJ SOLN
2.0000 mg | Freq: Once | INTRAMUSCULAR | Status: AC | PRN
  Administered 2020-06-28: 2 mg via INTRAMUSCULAR
  Filled 2020-06-26: qty 1

## 2020-06-26 MED ORDER — POTASSIUM CHLORIDE CRYS ER 20 MEQ PO TBCR
40.0000 meq | EXTENDED_RELEASE_TABLET | Freq: Once | ORAL | Status: AC
  Administered 2020-06-26: 40 meq via ORAL
  Filled 2020-06-26: qty 2

## 2020-06-26 MED ORDER — DIPHENHYDRAMINE HCL 25 MG PO CAPS: 50.0000 mg | ORAL_CAPSULE | Freq: Once | ORAL | Status: DC | PRN

## 2020-06-26 NOTE — Progress Notes (Signed)
PHARMACY - PHYSICIAN COMMUNICATION CRITICAL VALUE ALERT - BLOOD CULTURE IDENTIFICATION (BCID)  Donald Curry is an 25 y.o. male who presented to [facility] on 06/25/2020   Assessment:  1/2 coag neg staph   Name of physician (or Provider) Contacted: [place]  Current antibiotics: Vanc ceftriaxone acyclovir  Changes to prescribed antibiotics recommended:  None  Results for orders placed or performed during the hospital encounter of 06/25/20  Blood Culture ID Panel (Reflexed) (Collected: 06/25/2020 11:02 AM)  Result Value Ref Range   Enterococcus faecalis NOT DETECTED NOT DETECTED   Enterococcus Faecium NOT DETECTED NOT DETECTED   Listeria monocytogenes NOT DETECTED NOT DETECTED   Staphylococcus species DETECTED (A) NOT DETECTED   Staphylococcus aureus (BCID) NOT DETECTED NOT DETECTED   Staphylococcus epidermidis NOT DETECTED NOT DETECTED   Staphylococcus lugdunensis NOT DETECTED NOT DETECTED   Streptococcus species NOT DETECTED NOT DETECTED   Streptococcus agalactiae NOT DETECTED NOT DETECTED   Streptococcus pneumoniae NOT DETECTED NOT DETECTED   Streptococcus pyogenes NOT DETECTED NOT DETECTED   A.calcoaceticus-baumannii NOT DETECTED NOT DETECTED   Bacteroides fragilis NOT DETECTED NOT DETECTED   Enterobacterales NOT DETECTED NOT DETECTED   Enterobacter cloacae complex NOT DETECTED NOT DETECTED   Escherichia coli NOT DETECTED NOT DETECTED   Klebsiella aerogenes NOT DETECTED NOT DETECTED   Klebsiella oxytoca NOT DETECTED NOT DETECTED   Klebsiella pneumoniae NOT DETECTED NOT DETECTED   Proteus species NOT DETECTED NOT DETECTED   Salmonella species NOT DETECTED NOT DETECTED   Serratia marcescens NOT DETECTED NOT DETECTED   Haemophilus influenzae NOT DETECTED NOT DETECTED   Neisseria meningitidis NOT DETECTED NOT DETECTED   Pseudomonas aeruginosa NOT DETECTED NOT DETECTED   Stenotrophomonas maltophilia NOT DETECTED NOT DETECTED   Candida albicans NOT DETECTED NOT DETECTED    Candida auris NOT DETECTED NOT DETECTED   Candida glabrata NOT DETECTED NOT DETECTED   Candida krusei NOT DETECTED NOT DETECTED   Candida parapsilosis NOT DETECTED NOT DETECTED   Candida tropicalis NOT DETECTED NOT DETECTED   Cryptococcus neoformans/gattii NOT DETECTED NOT DETECTED   Elmer Sow, PharmD, BCCCP Clinical Pharmacist [phone]  Please check AMION for all [facility] numbers  06/26/2020 9:20 AM
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
## 2020-06-26 NOTE — Plan of Care (Signed)
  Problem: Education: Goal: Knowledge of General Education information will improve Description: Including pain rating scale, medication(s)/side effects and non-pharmacologic comfort measures Outcome: Progressing   Problem: Health Behavior/Discharge Planning: Goal: Ability to manage health-related needs will improve Outcome: Progressing   Problem: Clinical Measurements: Goal: Ability to maintain clinical measurements within normal limits will improve Outcome: Progressing Goal: Diagnostic test results will improve Outcome: Progressing Goal: Respiratory complications will improve Outcome: Progressing   Problem: Activity: Goal: Risk for activity intolerance will decrease Outcome: Progressing   Problem: Coping: Goal: Level of anxiety will decrease Outcome: Progressing   Problem: Elimination: Goal: Will not experience complications related to bowel motility Outcome: Progressing   Problem: Pain Managment: Goal: General experience of comfort will improve Outcome: Progressing   Problem: Safety: Goal: Ability to remain free from injury will improve Outcome: Progressing   Problem: Skin Integrity: Goal: Risk for impaired skin integrity will decrease Outcome: Progressing   

## 2020-06-26 NOTE — Progress Notes (Signed)
Family Medicine Teaching Service Daily Progress Note Intern Pager: [phone]  Patient name: Donald Curry Medical record number: [id number] Date of birth: [birth year] Age: 25 y.o. Gender: male  Primary Care Provider: Patient, No Pcp Per Consultants: Psych, Neuro Code Status: Full  Pt Overview and Major Events to Date:  3/25 - Admitted, IVC placed  Assessment and Plan Donald Curry is a 26 y.o. male presenting with altered mental status. PMH is significant for ADHD.   Altered mental status  Acute Encephalopathy  c/f Sepsis  On day of admission, patient was agitated and aggressive and received a as needd midazolam 5 mg, Geodon 20 mg, Ativan 1 mg p.o., Ativan 2 mg IM, Haldol 5, Benadryl 25.  Initially there was concern for possible sepsis given patient's presentation and patient was started on meningitic dosing of acyclovir, ceftriaxone, vancomycin.  LP was attempted initially due to concern for meningitis commencing encephalopathy but was unable to be obtained.  EEG was obtained this morning and was normal.  We are awaiting MRI of the brain, if no signs of meningitis we will discontinue antibiotics as there is very little concern for infectious state given current history of substance ingestion as well as the patient's physical exam being very reassuring-WBC on admission was 15.4, currently 10.6 and patient remains afebrile.  Of note, blood cultures were collected and there is a bottle growing with coag negative staph, will await speciation but most likely contaminant (anticipate will grow out to be staph epi) we will continue to follow and address antibiotics if appropriate at that time. At this time, believe that psychiatric origin is most likely for patient's presentation and will follow-up with psychiatry recommendations and assessment. -Consulted IR, neurology, appreciate recommendation -Psychiatry consulted appreciate recommendations -Continuous pulse ox and cardiac monitoring -Continuing  Vanco, ceftriaxone, acyclovir meningitic dosing until MRI completed, at which time we will likely DC -Follow-up blood drug screen -Follow-up CIWA's -Follow-up urine and blood cultures -Contact girlfriend about potential other substances patient may have used -Continue mIVF  Elevated CK Elevated Ck of 1130, that was not responsive to fluids and was 1446 upon repeat after total of 2.5 L of LR bolus as well as maintenance IVF.  Less likely that this is rhabdomyolysis as the elevation is not elevated at the level we would expect, but also typically expect hyperkalemia and patient currently has hypokalemia, and patient has no current risk factors.  Could be elevated due to recent THC/drug use as well as medication administration in the ED. -Continue to monitor, will repeat CK in the afternoon and stop trending if decreasing  Hypokalemia Potassium 3.3> 3.2 and was repleted with 40 mEq.  Unsure if this could be possibly contributing to patient's picture.  Certain psych medications such as antipsychotics can also contribute patient was given Geodon on 3/25.  There also case reports that hypokalemia can exacerbate patients with chronic mental conditions, there is no such diagnosis except for ADHD on patient's list, we will have psychiatry weigh in given patient's symptoms upon presentation. There is consideration that some patients with chronic marijuana use can have lower potassium and serum levels, typically associated with increased carbohydrate intake while intoxicated [email or web address]).   Unlikely that these things are contributing to patient's lab values but an interesting consideration. -Recheck BMP this afternoon -Replete as appropriate  THC Use  UDS admission positive for THC.  Patient mentioned taking delta 10 THC to neurology during admission, which reportedly has less side effects of paranoia and altered mental status. - encourage cessation  ADHD Per  chart review. No medications on chart.   FEN/GI: Regular diet Prophylaxis: SCDs    Status is: Inpatient  Remains inpatient appropriate because:Ongoing diagnostic testing needed not appropriate for outpatient work up and Unsafe d/c plan   Dispo:  Patient From: Home  Planned Disposition: Home  Medically stable for discharge: No       Subjective:  Patient reports that he has no complaints right now or questions.  Denies chest pain or difficulty breathing.  Does state that he feels like his "heart is fluttering" but does state that this is at baseline for him and nothing new.  He does not feel short of breath does not have any leg swelling and is able to say that he is at [facility] and when asked why he was here he said "I made a big  Mistake" but would not speak further on it.  Of note, signout from the night team reported that the mother stated that the patient "is a slow metabolizer and took a long time to return to baseline after anesthesia from prior surgery"  Objective: Temp:  [97.9 F (36.6 C)-98.8 F (37.1 C)] 98.4 F (36.9 C) (03/26 0410) Pulse Rate:  [91-150] 108 (03/26 0410) Resp:  [14-29] 18 (03/26 0410) BP: (98-167)/(46-106) 133/88 (03/26 0410) SpO2:  [83 %-100 %] 99 % (03/26 0410) Weight:  [135.1 kg] 135.1 kg (03/25 1030) Physical Exam: General: NAD, sitting up in bed, well-appearing Cardiovascular: Tachycardic, no murmur appreciated Respiratory: Comfortable on room air, no increased work of breathing, clear to auscultation bilaterally Abdomen: Soft, nontender, nondistended, bowel sounds present Extremities: Moving all extremities equally and appropriately, was able to put on gown by himself, no lower extremity edema  Laboratory: Recent Labs  Lab 06/25/20 0651 06/25/20 0955 06/26/20 0207  WBC 15.4*  --  10.6*  HGB 16.0 16.3 14.3  HCT 47.9 48.0 39.5  PLT 399  --  283   Recent Labs  Lab 06/25/20 0651 06/25/20 0955 06/25/20 2027 06/26/20 0207   NA 136 139 137 136  K 3.3* 3.6 3.3* 3.2*  CL 102  --  104 105  CO2 17*  --  24 20*  BUN 10  --  8 7  CREATININE 0.99  --  0.77 0.78  CALCIUM 9.5  --  8.8* 8.6*  PROT 8.6*  --   --  7.0  BILITOT 1.4*  --   --  1.3*  1.4*  ALKPHOS 81  --   --  64  ALT 25  --   --  27  AST 32  --   --  39  GLUCOSE 88  --  104* 96     Imaging/Diagnostic Tests: CT HEAD WO CONTRAST  Result Date: 06/25/2020 CLINICAL DATA:  Delirium EXAM: CT HEAD WITHOUT CONTRAST TECHNIQUE: Contiguous axial images were obtained from the base of the skull through the vertex without intravenous contrast. COMPARISON:  None. FINDINGS: Brain: No evidence of acute infarction, hemorrhage, hydrocephalus, extra-axial collection or mass lesion/mass effect. Vascular: No hyperdense vessel or unexpected calcification. Skull: Normal. Negative for fracture or focal lesion. Sinuses/Orbits: No acute finding. Other: None. IMPRESSION: No acute intracranial findings. Electronically Signed   By: Duanne Guess D.O.   On: 06/25/2020 11:36   EEG adult  Result Date: 06/26/2020 Lorrene Reid, MD     06/26/2020  8:25 AM [facility] EEG HISTORY:  25 year old male presents with altered mental status. INTRODUCTION:  A digital EEG was performed in the  laboratory using the standard international 10/20 system of electrode placement in addition to one channel of EKG monitoring.  Hyperventilation and Photic Stimulation were not performed.  This tracing captures wakefulness through drowsiness. DESCRIPTION OF RECORD:  In the most alert state the alpha rhythm is 9 Hz in frequency, which is seen in the occipital region and attenuates with eye opening and is bilaterally synchronous and symmetrical.  No focal slowing, sharp waves, or epileptiform discharges are seen. Heart rate was regular at a rate of 100 bpm. IMPRESSION:  This is a normal adult EEG in the awake and drowsy states. No focal slowing, focal abnormalities, epileptiform discharges,  or electrographic seizures are seen.  Of note, a normal EEG does not exclude the diagnosis of seizure disorder.  A repeat sleep deprived EEG or 24 hour EEG could be considered.  Clinical correlation is recommended.     Evelena Leyden, DO 06/26/2020, 9:19 AM PGY-1, [facility] Family Medicine FPTS Intern pager: [phone], text pages welcome

## 2020-06-26 NOTE — Consult Note (Signed)
[facility] Face-to-Face Psychiatry Consult   Reason for Consult:  Acute psychosis Referring Physician:  Dr Melba Coon Patient Identification: Donald Curry MRN:  [id number] Principal Diagnosis: AMS Diagnosis:  Active Problems:   Cannabis-induced mood disorder (HCC)   Encephalitis   AMS (altered mental status)   Total Time spent with patient: 45 minutes  Subjective:   Donald Curry is a 25 y.o. male patient admitted with aggression, confusion, AMS; psych consult for evaluation of acute psychosis.  Patient seen and evaluated in person by this provider.  Dishelved with odd behaviors at times, difficulty processing information on occasion in the interview.  Difficult remembering the reason he was brought to the ED and states, "I was acting not in time but in time the whole time."  Denies auditory and visual hallucinations, paranoia, depression, mania symptoms in the past.  Appetite is "good", sleep is great.  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  Rambles about edibles and making them for others and not using.  When asked if his behavior changed when he used Delta 10, "I had big behavior changes."  Educated on the use of Delta 8 and 10 with adverse effects of psychosis which this provider has seen frequently recently in the EDs and child/adolescent with increases in anxiety and triggers of psychosis.  He is agreeable for his family to remove these substances, sister at this bedside.  Caveat:  His sister does have bipolar and this could be his first episode, triggered by the cannabis.  He also has an infection, treating with vancomycin.  HPI per  neurologist:  Donald Curry is a 25 y.o. male with no pertinent medical history who presented to the ED today after he was -brought by his father for concerns of agitation and aggression. His girlfriend noticed last night that he was acting strangely and called Mr. Gust father for help when he went to take the dog for a walk in the early morning hours and returned home without the dog. Mr. Matusek continued acting confused and aggressive for his father so he was brought to the ED for further evaluation. In the ED, he was verbally aggressive with tachycardia, low grade temperature, with tachypnea and an elevated venous lactic acid. EDP was concerned for infectious process as cause of presentation. His UDS was also positive for THC. He required Geodon, Haldol, Benadryl, Ativan, and Versed in the ED for aggression and agitation. He was taken for fluoro-guided LP which was unsuccessful due to patient movements and contaminating the sterile field.   On examination today patient has disorganized thoughts and pressured speech with mood lability. Some grandiosity is noted as well. For example he references "having no weaknesses" and being "the biggest and smallest college and went to school for clowning everyone". He claims he is in college but finished Electrical engineer college" and got his PhD online "in everything" and then when asked the specific subject, proceeds to list "mathematics, chemistry, biology, and physics", then states he wants to see his phone to be able to tell examiners the rest of the subjects he obtained a PhD in. Patient also begins to talk about believing that "someone  is cheating", stating that the identity is someone named "father" and when asked if it was his father or someone named father he states "yeah, I guess my father". He then goes on to state that his father is cheating on his mother and later states that his father "plays on his cheating device" while mimicking someone tapping away at a  smartphone. Also endorses talking to "false prophets" today and describes them as wearing doctor's clothing and masks, but they were "lying to me" which he sensed because they did not give him good eye contact. He endorses using a drug called "Delta-10" that he got from "some place called [place]" because it "keeps you up, makes you feel smart, and you are on autopilot and already know what is going to happen". Intermittently becomes agitated stating that him being "stuck in the hospital" is "my father's fault" but also states that he feels like he is "stuck in [employer]" when it is "sometimes my fault". He frequently becomes agitated with some of the questions asked He claims that "there is a bug that needs to be rectified". He has paranoid thoughts as well claiming that he has heard others speak behind his back about "his slowed thoughts" and states that he has "been beat up" with "people talking behind my back". Also endorses visual hallucinations during assessment pointing to the ceiling while stating that he is seeing a bug running on the ceiling. States that he has a website that nobody is supposed to know about and he thinks it is connected to another website that he is "not even allowed to access": [email or web address].   Past Psychiatric History: none  Risk to Self:  potentially related to his current state Risk to Others:  aggressive at times Prior Inpatient Therapy:  none Prior Outpatient Therapy:  none  Past Medical History:  Past Medical History:  Diagnosis Date  . Obesity    History reviewed. No pertinent surgical history. Family History:  Family History  Problem Relation Age of Onset  . Mood Disorder Sister        per mother, patient's sister with history of ? bipolar, SI, 2X suicide attempts    Family Psychiatric  History: sister with bipolar d/o Social History:  Social History   Substance and Sexual Activity  Alcohol Use Yes     Social History   Substance and Sexual  Activity  Drug Use Not on file    Social History   Socioeconomic History  . Marital status: Single    Spouse name: Not on file  . Number of children: Not on file  . Years of education: Not on file  . Highest education level: Not on file  Occupational History  . Not on file  Tobacco Use  . Smoking status: Not on file  . Smokeless tobacco: Not on file  Substance and Sexual Activity  . Alcohol use: Yes  . Drug use: Not on file  . Sexual activity: Not on file  Other Topics Concern  . 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   Has a Social research officer, government with masters in 3.5 years    Social Determinants of Corporate investment banker Strain: Not on file  Food Insecurity: Not on file  Transportation Needs: Not on file  Physical Activity: Not on file  Stress:  Not on file  Social Connections: Not on file   Additional Social History:    Allergies:  No Known Allergies  Labs:  Results for orders placed or performed during the hospital encounter of 06/25/20 (from the past 48 hour(s))  Comprehensive metabolic panel     Status: Abnormal   Collection Time: 06/25/20  6:51 AM  Result Value Ref Range   Sodium 136 135 - 145 mmol/L   Potassium 3.3 (L) 3.5 - 5.1 mmol/L   Chloride 102 98 - 111 mmol/L   CO2 17 (L) 22 - 32 mmol/L   Glucose, Bld 88 70 - 99 mg/dL    Comment: Glucose reference range applies only to samples taken after fasting for at least 8 hours.   BUN 10 6 - 20 mg/dL   Creatinine, Ser 1.610.99 0.61 - 1.24 mg/dL   Calcium 9.5 8.9 - 09.610.3 mg/dL   Total Protein 8.6 (H) 6.5 - 8.1 g/dL   Albumin 4.8 3.5 - 5.0 g/dL   AST 32 15 - 41 U/L   ALT 25 0 - 44 U/L   Alkaline Phosphatase 81 38 - 126 U/L   Total Bilirubin 1.4 (H) 0.3 - 1.2 mg/dL   GFR, Estimated >04>60 >54>60 mL/min    Comment: (NOTE) Calculated using the CKD-EPI Creatinine Equation (2021)     Anion gap 17 (H) 5 - 15    Comment: Performed at [facility], [address] [place], [place] [address]  cbc     Status: Abnormal   Collection Time: 06/25/20  6:51 AM  Result Value Ref Range   WBC 15.4 (H) 4.0 - 10.5 K/uL   RBC 5.49 4.22 - 5.81 MIL/uL   Hemoglobin 16.0 13.0 - 17.0 g/dL   HCT 91.447.9 78.239.0 - 95.652.0 %   MCV 87.2 80.0 - 100.0 fL   MCH 29.1 26.0 - 34.0 pg   MCHC 33.4 30.0 - 36.0 g/dL   RDW 21.312.2 08.611.5 - 57.815.5 %   Platelets 399 150 - 400 K/uL   nRBC 0.0 0.0 - 0.2 %    Comment: Performed at [facility], [address] [place], [place] [address]  Ethanol     Status: None   Collection Time: 06/25/20  6:51 AM  Result Value Ref Range   Alcohol, Ethyl (B) <10 <10 mg/dL    Comment: (NOTE) Lowest detectable limit for serum alcohol is 10 mg/dL.  For medical purposes only. Performed at [facility], [address] [place], [place] [address]   Salicylate level     Status: Abnormal   Collection Time: 06/25/20  6:51 AM  Result Value Ref Range   Salicylate Lvl <7.0 (L) 7.0 - 30.0 mg/dL    Comment: Performed at [facility], [address] [place], [place] [address]  Acetaminophen level     Status: Abnormal   Collection Time: 06/25/20  6:51 AM  Result Value Ref Range   Acetaminophen (Tylenol), Serum <10 (L) 10 - 30 ug/mL    Comment: (NOTE) Therapeutic concentrations vary significantly. A range of 10-30 ug/mL  may be an effective concentration for many patients. However, some  are best treated at concentrations outside of this range. Acetaminophen concentrations >150 ug/mL at 4 hours after ingestion  and >50 ug/mL at 12 hours after ingestion are often associated with  toxic reactions.  Performed at [facility], [address] [place], [place] [address]   TSH     Status: None  Collection Time: 06/25/20  6:51 AM  Result Value Ref Range   TSH 2.310 0.350 - 4.500 uIU/mL    Comment: Performed by a 3rd Generation assay with a functional  sensitivity of <=0.01 uIU/mL. Performed at [facility], [address] [place], [place] [address]   Urine culture     Status: None   Collection Time: 06/25/20  7:17 AM   Specimen: In/Out Cath Urine  Result Value Ref Range   Specimen Description IN/OUT CATH URINE    Special Requests NONE    Culture      NO GROWTH Performed at [facility], [address], [place], [place] [address]    Report Status 06/26/2020 FINAL   Ammonia     Status: None   Collection Time: 06/25/20  9:45 AM  Result Value Ref Range   Ammonia 20 9 - 35 umol/L    Comment: Performed at [facility], [address], [place], [place] [address]  Lactic acid, plasma     Status: Abnormal   Collection Time: 06/25/20  9:45 AM  Result Value Ref Range   Lactic Acid, Venous 2.1 (HH) 0.5 - 1.9 mmol/L    Comment: CRITICAL RESULT CALLED TO, READ BACK BY AND VERIFIED WITH: HARDY,R RN @ 1030 06/25/20 LEONARD,A Performed at [facility], [address] [place], [place] [address]   Protime-INR     Status: None   Collection Time: 06/25/20  9:45 AM  Result Value Ref Range   Prothrombin Time 13.7 11.4 - 15.2 seconds   INR 1.1 0.8 - 1.2    Comment: (NOTE) INR goal varies based on device and disease states. Performed at [facility], [address] [place], [place] [address]   APTT     Status: None   Collection Time: 06/25/20  9:45 AM  Result Value Ref Range   aPTT 34 24 - 36 seconds    Comment: Performed at [facility], [address] [place], [place] [address]  CBG monitoring, ED     Status: Abnormal   Collection Time: 06/25/20  9:47 AM  Result Value Ref Range   Glucose-Capillary 105 (H) 70 - 99 mg/dL    Comment: Glucose reference range applies only to samples taken after fasting for at least 8 hours.  I-Stat venous blood gas, ED     Status: Abnormal   Collection Time: 06/25/20  9:55 AM  Result Value Ref Range   pH, Ven 7.443 (H) 7.250 - 7.430   pCO2, Ven 28.3 (L) 44.0 - 60.0  mmHg   pO2, Ven 89.0 (H) 32.0 - 45.0 mmHg   Bicarbonate 19.3 (L) 20.0 - 28.0 mmol/L   TCO2 20 (L) 22 - 32 mmol/L   O2 Saturation 97.0 %   Acid-base deficit 3.0 (H) 0.0 - 2.0 mmol/L   Sodium 139 135 - 145 mmol/L   Potassium 3.6 3.5 - 5.1 mmol/L   Calcium, Ion 1.07 (L) 1.15 - 1.40 mmol/L   HCT 48.0 39.0 - 52.0 %   Hemoglobin 16.3 13.0 - 17.0 g/dL   Sample type VENOUS   Urinalysis, Complete w Microscopic Urine, Random     Status: Abnormal   Collection Time: 06/25/20 10:45 AM  Result Value Ref Range   Color, Urine AMBER (A) YELLOW    Comment: BIOCHEMICALS MAY BE AFFECTED BY COLOR   APPearance CLEAR CLEAR   Specific Gravity, Urine >1.030 (H) 1.005 - 1.030   pH 6.0 5.0 -  8.0   Glucose, UA NEGATIVE NEGATIVE mg/dL   Hgb urine dipstick TRACE (A) NEGATIVE   Bilirubin Urine MODERATE (A) NEGATIVE   Ketones, ur >80 (A) NEGATIVE mg/dL   Protein, ur 009 (A) NEGATIVE mg/dL   Nitrite NEGATIVE NEGATIVE   Leukocytes,Ua NEGATIVE NEGATIVE   Squamous Epithelial / LPF NONE SEEN 0 - 5   WBC, UA 0-5 0 - 5 WBC/hpf   RBC / HPF 0-5 0 - 5 RBC/hpf   Bacteria, UA RARE (A) NONE SEEN   Mucus PRESENT    Hyaline Casts, UA PRESENT    Granular Casts, UA PRESENT     Comment: Performed at [facility], [address], [place], [place] [address]  Urine rapid drug screen (hosp performed)     Status: Abnormal   Collection Time: 06/25/20 10:45 AM  Result Value Ref Range   Opiates NONE DETECTED NONE DETECTED   Cocaine NONE DETECTED NONE DETECTED   Benzodiazepines NONE DETECTED NONE DETECTED   Amphetamines NONE DETECTED NONE DETECTED   Tetrahydrocannabinol POSITIVE (A) NONE DETECTED   Barbiturates NONE DETECTED NONE DETECTED    Comment: (NOTE) DRUG SCREEN FOR MEDICAL PURPOSES ONLY.  IF CONFIRMATION IS NEEDED FOR ANY PURPOSE, NOTIFY LAB WITHIN 5 DAYS.  LOWEST DETECTABLE LIMITS FOR URINE DRUG SCREEN Drug Class                     Cutoff (ng/mL) Amphetamine and metabolites    1000 Barbiturate and  metabolites    200 Benzodiazepine                 200 Tricyclics and metabolites     300 Opiates and metabolites        300 Cocaine and metabolites        300 THC                            50 Performed at [facility], [address] [place], [place] [address]   Resp Panel by RT-PCR (Flu A&B, Covid) Nasopharyngeal Swab     Status: None   Collection Time: 06/25/20 10:46 AM   Specimen: Nasopharyngeal Swab; Nasopharyngeal(NP) swabs in vial transport medium  Result Value Ref Range   SARS Coronavirus 2 by RT PCR NEGATIVE NEGATIVE    Comment: (NOTE) SARS-CoV-2 target nucleic acids are NOT DETECTED.  The SARS-CoV-2 RNA is generally detectable in upper respiratory specimens during the acute phase of infection. The lowest concentration of SARS-CoV-2 viral copies this assay can detect is 138 copies/mL. A negative result does not preclude SARS-Cov-2 infection and should not be used as the sole basis for treatment or other patient management decisions. A negative result may occur with  improper specimen collection/handling, submission of specimen other than nasopharyngeal swab, presence of viral mutation(s) within the areas targeted by this assay, and inadequate number of viral copies(<138 copies/mL). A negative result must be combined with clinical observations, patient history, and epidemiological information. The expected result is Negative.  Fact Sheet for Patients:  BloggerCourse.com  Fact Sheet for Healthcare Providers:  SeriousBroker.it  This test is no t yet approved or cleared by the [place] FDA and  has been authorized for detection and/or diagnosis of SARS-CoV-2 by FDA under an Emergency Use Authorization (EUA). This EUA will remain  in effect (meaning this test can be used) for the duration of the COVID-19 declaration under Section 564(b)(1) of the Act, 21 U.S.C.section 360bbb-3(b)(1),  unless the authorization is  terminated  or revoked sooner.       Influenza A by PCR NEGATIVE NEGATIVE   Influenza B by PCR NEGATIVE NEGATIVE    Comment: (NOTE) The Xpert Xpress SARS-CoV-2/FLU/RSV plus assay is intended as an aid in the diagnosis of influenza from Nasopharyngeal swab specimens and should not be used as a sole basis for treatment. Nasal washings and aspirates are unacceptable for Xpert Xpress SARS-CoV-2/FLU/RSV testing.  Fact Sheet for Patients: BloggerCourse.com  Fact Sheet for Healthcare Providers: SeriousBroker.it  This test is not yet approved or cleared by the [place] FDA and has been authorized for detection and/or diagnosis of SARS-CoV-2 by FDA under an Emergency Use Authorization (EUA). This EUA will remain in effect (meaning this test can be used) for the duration of the COVID-19 declaration under Section 564(b)(1) of the Act, 21 U.S.C. section 360bbb-3(b)(1), unless the authorization is terminated or revoked.  Performed at [facility], [address] [place], [place] [address]   Blood culture (routine x 2)     Status: None (Preliminary result)   Collection Time: 06/25/20 11:02 AM   Specimen: BLOOD  Result Value Ref Range   Specimen Description BLOOD LEFT ANTECUBITAL    Special Requests      BOTTLES DRAWN AEROBIC AND ANAEROBIC Blood Culture adequate volume   Culture  Setup Time      IN BOTH AEROBIC AND ANAEROBIC BOTTLES GRAM POSITIVE COCCI CRITICAL RESULT CALLED TO, READ BACK BY AND VERIFIED WITH: RBV L[place]le [place] [id number] FCP Performed at [facility], [address] [place], [place] [address]    Culture GRAM POSITIVE COCCI    Report Status PENDING   Blood Culture ID Panel (Reflexed)     Status: Abnormal   Collection Time: 06/25/20 11:02 AM  Result Value Ref Range   Enterococcus faecalis NOT DETECTED NOT DETECTED   Enterococcus Faecium NOT DETECTED NOT DETECTED   Listeria monocytogenes NOT DETECTED  NOT DETECTED   Staphylococcus species DETECTED (A) NOT DETECTED    Comment: CRITICAL RESULT CALLED TO, READ BACK BY AND VERIFIED WITH: RBV MACCIA, M. [id number] FCP    Staphylococcus aureus (BCID) NOT DETECTED NOT DETECTED   Staphylococcus epidermidis NOT DETECTED NOT DETECTED   Staphylococcus lugdunensis NOT DETECTED NOT DETECTED   Streptococcus species NOT DETECTED NOT DETECTED   Streptococcus agalactiae NOT DETECTED NOT DETECTED   Streptococcus pneumoniae NOT DETECTED NOT DETECTED   Streptococcus pyogenes NOT DETECTED NOT DETECTED   A.calcoaceticus-baumannii NOT DETECTED NOT DETECTED   Bacteroides fragilis NOT DETECTED NOT DETECTED   Enterobacterales NOT DETECTED NOT DETECTED   Enterobacter cloacae complex NOT DETECTED NOT DETECTED   Escherichia coli NOT DETECTED NOT DETECTED   Klebsiella aerogenes NOT DETECTED NOT DETECTED   Klebsiella oxytoca NOT DETECTED NOT DETECTED   Klebsiella pneumoniae NOT DETECTED NOT DETECTED   Proteus species NOT DETECTED NOT DETECTED   Salmonella species NOT DETECTED NOT DETECTED   Serratia marcescens NOT DETECTED NOT DETECTED   Haemophilus influenzae NOT DETECTED NOT DETECTED   Neisseria meningitidis NOT DETECTED NOT DETECTED   Pseudomonas aeruginosa NOT DETECTED NOT DETECTED   Stenotrophomonas maltophilia NOT DETECTED NOT DETECTED   Candida albicans NOT DETECTED NOT DETECTED   Candida auris NOT DETECTED NOT DETECTED   Candida glabrata NOT DETECTED NOT DETECTED   Candida krusei NOT DETECTED NOT DETECTED   Candida parapsilosis NOT DETECTED NOT DETECTED   Candida tropicalis NOT DETECTED NOT DETECTED   Cryptococcus neoformans/gattii NOT DETECTED  NOT DETECTED    Comment: Performed at [facility], [address] [place], [place] [address]  RPR     Status: None   Collection Time: 06/25/20  1:28 PM  Result Value Ref Range   RPR Ser Ql NON REACTIVE NON REACTIVE    Comment: Performed at [facility], [address], [place], [place]  [address]  Blood gas, venous     Status: Abnormal   Collection Time: 06/25/20  4:14 PM  Result Value Ref Range   pH, Ven 7.387 7.250 - 7.430   pCO2, Ven 36.2 (L) 44.0 - 60.0 mmHg   pO2, Ven 88.5 (H) 32.0 - 45.0 mmHg   Bicarbonate 21.3 20.0 - 28.0 mmol/L   Acid-base deficit 2.9 (H) 0.0 - 2.0 mmol/L   O2 Saturation 96.5 %   Patient temperature 37.0    Drawn by 164    Sample type VENOUS     Comment: Performed at [facility], [address] [place], [place] [address]  HIV Antibody (routine testing w rflx)     Status: None   Collection Time: 06/25/20  4:14 PM  Result Value Ref Range   HIV Screen 4th Generation wRfx Non Reactive Non Reactive    Comment: Performed at [facility], [address], [place], [place] [address]  CK     Status: Abnormal   Collection Time: 06/25/20  4:14 PM  Result Value Ref Range   Total CK 1,130 (H) 49 - 397 U/L    Comment: Performed at [facility], [address], [place], [place] [address]  TSH     Status: None   Collection Time: 06/25/20  4:14 PM  Result Value Ref Range   TSH 2.720 0.350 - 4.500 uIU/mL    Comment: Performed by a 3rd Generation assay with a functional sensitivity of <=0.01 uIU/mL. Performed at [facility], [address], [place], [place] [address]   Ethanol     Status: None   Collection Time: 06/25/20  4:14 PM  Result Value Ref Range   Alcohol, Ethyl (B) <10 <10 mg/dL    Comment: (NOTE) Lowest detectable limit for serum alcohol is 10 mg/dL.  For medical purposes only. Performed at [facility], [address], [place], [place] [address]   Basic metabolic panel     Status: Abnormal   Collection Time: 06/25/20  8:27 PM  Result Value Ref Range   Sodium 137 135 - 145 mmol/L   Potassium 3.3 (L) 3.5 - 5.1 mmol/L   Chloride 104 98 - 111 mmol/L   CO2 24 22 - 32 mmol/L   Glucose, Bld 104 (H) 70 - 99 mg/dL    Comment: Glucose reference range applies only to samples taken after fasting for at least 8 hours.    BUN 8 6 - 20 mg/dL   Creatinine, Ser 9.62 0.61 - 1.24 mg/dL   Calcium 8.8 (L) 8.9 - 10.3 mg/dL   GFR, Estimated >95 >28 mL/min    Comment: (NOTE) Calculated using the CKD-EPI Creatinine Equation (2021)    Anion gap 9 5 - 15    Comment: Performed at [facility], [address], [place], [place] [address]  Lactic acid, plasma     Status: None   Collection Time: 06/25/20  8:27 PM  Result Value Ref Range   Lactic Acid, Venous 0.9 0.5 - 1.9 mmol/L    Comment: Performed at [facility], [address], [place], [place] [address]  CBC with Differential/Platelet     Status: Abnormal   Collection Time: 06/26/20  2:07 AM  Result Value Ref Range   WBC 10.6 (H) 4.0 - 10.5 K/uL   RBC 4.75 4.22 - 5.81 MIL/uL   Hemoglobin 14.3 13.0 - 17.0 g/dL   HCT 60.4 54.0 - 98.1 %   MCV 83.2 80.0 - 100.0 fL   MCH 30.1 26.0 - 34.0 pg   MCHC 36.2 (H) 30.0 - 36.0 g/dL   RDW 19.1 47.8 - 29.5 %   Platelets 283 150 - 400 K/uL    Comment: SPECIMEN CHECKED FOR CLOTS REPEATED TO VERIFY PLATELET CLUMPS NOTED ON SMEAR, COUNT APPEARS ADEQUATE    nRBC 0.0 0.0 - 0.2 %   Neutrophils Relative % 57 %   Neutro Abs 6.0 1.7 - 7.7 K/uL   Lymphocytes Relative 32 %   Lymphs Abs 3.4 0.7 - 4.0 K/uL   Monocytes Relative 10 %   Monocytes Absolute 1.1 (H) 0.1 - 1.0 K/uL   Eosinophils Relative 1 %   Eosinophils Absolute 0.1 0.0 - 0.5 K/uL   Basophils Relative 0 %   Basophils Absolute 0.0 0.0 - 0.1 K/uL   Immature Granulocytes 0 %   Abs Immature Granulocytes 0.03 0.00 - 0.07 K/uL    Comment: Performed at [facility], [address], [place], [place] [address]  Bilirubin, fractionated(tot/dir/indir)     Status: Abnormal   Collection Time: 06/26/20  2:07 AM  Result Value Ref Range   Total Bilirubin 1.3 (H) 0.3 - 1.2 mg/dL   Bilirubin, Direct 0.2 0.0 - 0.2 mg/dL   Indirect Bilirubin 1.1 (H) 0.3 - 0.9 mg/dL    Comment: Performed at [facility], [address] [place], [place] [address]  CK      Status: Abnormal   Collection Time: 06/26/20  2:07 AM  Result Value Ref Range   Total CK 1,446 (H) 49 - 397 U/L    Comment: Performed at [facility], [address], [place], [place] [address]  Comprehensive metabolic panel     Status: Abnormal   Collection Time: 06/26/20  2:07 AM  Result Value Ref Range   Sodium 136 135 - 145 mmol/L   Potassium 3.2 (L) 3.5 - 5.1 mmol/L   Chloride 105 98 - 111 mmol/L   CO2 20 (L) 22 - 32 mmol/L   Glucose, Bld 96 70 - 99 mg/dL    Comment: Glucose reference range applies only to samples taken after fasting for at least 8 hours.   BUN 7 6 - 20 mg/dL   Creatinine, Ser 9.52 0.61 - 1.24 mg/dL   Calcium 8.6 (L) 8.9 - 10.3 mg/dL   Total Protein 7.0 6.5 - 8.1 g/dL   Albumin 3.9 3.5 - 5.0 g/dL   AST 39 15 - 41 U/L   ALT 27 0 - 44 U/L   Alkaline Phosphatase 64 38 - 126 U/L   Total Bilirubin 1.4 (H) 0.3 - 1.2 mg/dL   GFR, Estimated >84 >13 mL/min    Comment: (NOTE) Calculated using the CKD-EPI Creatinine Equation (2021)    Anion gap 11 5 - 15    Comment: Performed at [facility], [address], [place], [place] [address]    Current Facility-Administered Medications  Medication Dose Route Frequency Provider Last Rate Last Admin  . 0.9 %  sodium chloride infusion   Intravenous Continuous Espinoza, Alejandra, DO 200  mL/hr at 06/26/20 1306 Infusion Verify at 06/26/20 1306  . acetaminophen (TYLENOL) tablet 650 mg  650 mg Oral Q6H PRN Cora Collum, DO       Or  . acetaminophen (TYLENOL) suppository 650 mg  650 mg Rectal Q6H PRN Idalia Needle, Victoria J, DO      . acyclovir (ZOVIRAX) 800 mg in dextrose 5 % 150 mL IVPB  10 mg/kg (Ideal) Intravenous Q8H Espinoza, Alejandra, DO   Stopped at 06/26/20 0601  . cefTRIAXone (ROCEPHIN) 2 g in sodium chloride 0.9 % 100 mL IVPB  2 g Intravenous Q12H Espinoza, Alejandra, DO   Stopped at 06/26/20 1041  . LORazepam (ATIVAN) injection 1 mg  1 mg Intravenous PRN Cora Collum, DO   1 mg at 06/26/20 1302  .  vancomycin (VANCOREADY) IVPB 1250 mg/250 mL  1,250 mg Intravenous Q8H Cora Collum, DO   Stopped at 06/26/20 1610  . vancomycin (VANCOREADY) IVPB 2000 mg/400 mL  2,000 mg Intravenous Once Cora Collum, DO        Musculoskeletal: Strength & Muscle Tone: within normal limits Gait & Station: did not witness Patient leans: N/A            Psychiatric Specialty Exam:  Presentation  General Appearance: Casual  Eye Contact:Fair  Speech:Clear and Coherent  Speech Volume:Normal  Handedness:Right   Mood and Affect  Mood:Anxious; Euphoric  Affect:Blunt   Thought Process  Thought Processes:Other (comment)  Descriptions of Associations:Tangential  Orientation:Full (Time, Place and Person)  Thought Content:Delusions  History of Schizophrenia/Schizoaffective disorder:No  Duration of Psychotic Symptoms:Less than six months  Hallucinations:Hallucinations: None  Ideas of Reference:Delusions  Suicidal Thoughts:Suicidal Thoughts: No  Homicidal Thoughts:Homicidal Thoughts: No   Sensorium  Memory:Immediate Fair; Recent Poor; Remote Fair  Judgment:Impaired  Insight:Lacking   Executive Functions  Concentration:Fair  Attention Span:Poor  Recall:Fair  Fund of Knowledge:Good  Language:Good   Psychomotor Activity  Psychomotor Activity:Psychomotor Activity: Normal   Assets  Assets:Desire for Improvement; Manufacturing systems engineer; Housing; Intimacy; Leisure Time; Resilience; Social Support; Talents/Skills; Vocational/Educational   Sleep  Sleep:Sleep: Good   Physical Exam: Physical Exam Vitals and nursing note reviewed.  Constitutional:      Appearance: Normal appearance.  HENT:     Head: Normocephalic.     Nose: Nose normal.  Pulmonary:     Effort: Pulmonary effort is normal.  Musculoskeletal:        General: Normal range of motion.  Neurological:     General: No focal deficit present.     Mental Status: He is alert and oriented to  person, place, and time.  Psychiatric:        Attention and Perception: He is inattentive.        Mood and Affect: Mood is anxious. Affect is blunt.        Speech: Speech is tangential.        Behavior: Behavior normal. Behavior is cooperative.        Thought Content: Thought content is delusional.        Cognition and Memory: Cognition is impaired. Memory is impaired.        Judgment: Judgment normal.    Review of Systems  Psychiatric/Behavioral: Positive for substance abuse. The patient is nervous/anxious.   All other systems reviewed and are negative.  Blood pressure (!) 162/94, pulse 96, temperature 98.7 F (37.1 C), temperature source Oral, resp. rate 17, height  (1.854 m), weight 135.1 kg, SpO2 96 %. Body mass index is 39.29 kg/m.  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 re-evaluate tomorrow  Nanine Means, NP 06/26/2020 1:11 PM

## 2020-06-26 NOTE — Procedures (Signed)
[facility] EEG  HISTORY:  25 year old male presents with altered mental status.  INTRODUCTION:  A digital EEG was performed in the laboratory using the standard international 10/20 system of electrode placement in addition to one channel of EKG monitoring.  Hyperventilation and Photic Stimulation were not performed.  This tracing captures wakefulness through drowsiness.  DESCRIPTION OF RECORD:  In the most alert state the alpha rhythm is 9 Hz in frequency, which is seen in the occipital region and attenuates with eye opening and is bilaterally synchronous and symmetrical.  No focal slowing, sharp waves, or epileptiform discharges are seen.  Heart rate was regular at a rate of 100 bpm.  IMPRESSION:  This is a normal adult EEG in the awake and drowsy states. No focal slowing, focal abnormalities, epileptiform discharges, or electrographic seizures are seen.  Of note, a normal EEG does not exclude the diagnosis of seizure disorder.  A repeat sleep deprived EEG or 24 hour EEG could be considered.  Clinical correlation is recommended.

## 2020-06-26 NOTE — Progress Notes (Signed)
FPTS Interim Progress Note  S: Went to bedside to give pt and his parents Cordelia Pen and [place]. I explained to them that MRI head was neg and we stopped his abx due to low concern for infection. Also explained his CK is high and we are unsure why but his kidney function is reassuring and we will keep monitoring it. Also explained the Psychiatrist evaluation and recommendation today. Given that Weston Brass is doing much better this afternoon I do not think Haldol 2mg  nightly is indicated just yet, however I explained that I would like to do the PRNs which were recommended. Both parents in agreement. Patient said '"if I am endangering anyone please give me the meds". He also apologized for his behavior yesterday. Both parents and pt are happy with the plan.  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   -No scheduled Haldol for now, could consider this is pt is worsening tomorrow -PRN meds: Benadryl 50mg , Haldol 2mg , ativan 2mg . RN to call physician for assessment if pt is requiring these medications -Continue to monitor HR -Monitor CK, BMP  , MD 06/26/2020, 5:56 PM PGY-2, [facility] pager [phone]

## 2020-06-26 NOTE — Progress Notes (Signed)
EEG was normal.   Awaiting Psychiatry input.   Electronically signed: Dr. Caryl Pina

## 2020-06-26 NOTE — Progress Notes (Addendum)
In with Dr. Allena Katz to see patient after page regarding tachycardia. Patient laying flat in bed, alert, oriented. Denies fevers, chills, chest pain, dyspnea.  Vitals:   06/26/20 1226 06/26/20 1611  BP: (!) 162/94 140/84  Pulse: 96 95  Resp: 17 19  Temp: 98.7 F (37.1 C) 98.4 F (36.9 C)  SpO2: 96% 97%   HR on exam low 90s. RRR. Lungs clear. No skin lesions.   Labs reviewed from AM, WBC count improving. He is growing GPC in 1/2 bottles (coagulase negative) MRI negative.  Tachycardia likely related to delta 10/8 use---also consideranxiety, patient reports some anxiety about healthcare after yesterday. Also consider infection, PE. Given negative MRI and blood cultures most likely skin flora (1/2 bottles, GPC), antibiotics discontinued. Tachycardia likely related to synthetic cannabis use as above. If HR elevates again, febrile, or clinical change will restart Vancomycin and consider CT for PE.   Discussed medication recommendations with patient.  Parents and sister not in room at time--will discuss Psychiatry recommendations with family per patient preference and as he currently under IVC.

## 2020-06-26 NOTE — Progress Notes (Signed)
EEG complete - results pending 

## 2020-06-26 NOTE — Progress Notes (Signed)
Patient up to restroom, HR up to 150's, asymptomatic MD notified. Will continue to monitor

## 2020-06-27 DIAGNOSIS — M6282 Rhabdomyolysis: Secondary | ICD-10-CM | POA: Diagnosis not present

## 2020-06-27 DIAGNOSIS — R4182 Altered mental status, unspecified: Secondary | ICD-10-CM | POA: Diagnosis not present

## 2020-06-27 DIAGNOSIS — F39 Unspecified mood [affective] disorder: Secondary | ICD-10-CM | POA: Diagnosis not present

## 2020-06-27 DIAGNOSIS — F12988 Cannabis use, unspecified with other cannabis-induced disorder: Secondary | ICD-10-CM | POA: Diagnosis not present

## 2020-06-27 LAB — CBC
HCT: 41.5 % (ref 39.0–52.0)
Hemoglobin: 14.2 g/dL (ref 13.0–17.0)
MCH: 29.5 pg (ref 26.0–34.0)
MCHC: 34.2 g/dL (ref 30.0–36.0)
MCV: 86.3 fL (ref 80.0–100.0)
Platelets: 325 10*3/uL (ref 150–400)
RBC: 4.81 MIL/uL (ref 4.22–5.81)
RDW: 12 % (ref 11.5–15.5)
WBC: 11.1 10*3/uL — ABNORMAL HIGH (ref 4.0–10.5)
nRBC: 0 % (ref 0.0–0.2)

## 2020-06-27 LAB — BASIC METABOLIC PANEL
Anion gap: 11 (ref 5–15)
BUN: 6 mg/dL (ref 6–20)
CO2: 19 mmol/L — ABNORMAL LOW (ref 22–32)
Calcium: 8.9 mg/dL (ref 8.9–10.3)
Chloride: 106 mmol/L (ref 98–111)
Creatinine, Ser: 0.66 mg/dL (ref 0.61–1.24)
GFR, Estimated: >60 mL/min (ref >60)
Glucose, Bld: 91 mg/dL (ref 70–99)
Potassium: 3.4 mmol/L — ABNORMAL LOW (ref 3.5–5.1)
Sodium: 136 mmol/L (ref 135–145)

## 2020-06-27 LAB — HEMOGLOBIN A1C
Hgb A1c MFr Bld: 5.1 % (ref 4.8–5.6)
Mean Plasma Glucose: 99.67 mg/dL

## 2020-06-27 LAB — CK
Total CK: 1154 U/L — ABNORMAL HIGH (ref 49–397)
Total CK: 1049 U/L — ABNORMAL HIGH (ref 49–397)

## 2020-06-27 MED ORDER — HALOPERIDOL 0.5 MG PO TABS
1.0000 mg | ORAL_TABLET | Freq: Two times a day (BID) | ORAL | Status: DC
  Administered 2020-06-27 (×2): 1 mg via ORAL
  Filled 2020-06-27 (×3): qty 2

## 2020-06-27 MED ORDER — POTASSIUM CHLORIDE CRYS ER 20 MEQ PO TBCR
30.0000 meq | EXTENDED_RELEASE_TABLET | Freq: Once | ORAL | Status: AC
  Administered 2020-06-27: 30 meq via ORAL
  Filled 2020-06-27: qty 1

## 2020-06-27 NOTE — Plan of Care (Signed)
  Problem: Education: Goal: Knowledge of General Education information will improve Description: Including pain rating scale, medication(s)/side effects and non-pharmacologic comfort measures Outcome: Progressing   Problem: Health Behavior/Discharge Planning: Goal: Ability to manage health-related needs will improve Outcome: Progressing   Problem: Clinical Measurements: Goal: Ability to maintain clinical measurements within normal limits will improve Outcome: Progressing Goal: Diagnostic test results will improve Outcome: Progressing Goal: Respiratory complications will improve Outcome: Progressing   Problem: Activity: Goal: Risk for activity intolerance will decrease Outcome: Progressing   Problem: Coping: Goal: Level of anxiety will decrease Outcome: Progressing   Problem: Elimination: Goal: Will not experience complications related to bowel motility Outcome: Progressing   Problem: Pain Managment: Goal: General experience of comfort will improve Outcome: Progressing   Problem: Safety: Goal: Ability to remain free from injury will improve Outcome: Progressing   Problem: Skin Integrity: Goal: Risk for impaired skin integrity will decrease Outcome: Progressing

## 2020-06-27 NOTE — Progress Notes (Signed)
Family Medicine Teaching Service Daily Progress Note Intern Pager: [phone]  Patient name: Donald Curry Medical record number: [id number] Date of birth: September 03, 1995 Age: 25 y.o. Gender: male  Primary Care Provider: Patient, No Pcp Per Consultants: Psych, Neuro (s/o)  Code Status: Full   Pt Overview and Major Events to Date:  3/25 - Admitted, IVC placed  Assessment and Plan: Donald Curry a 24 y.o.malepresenting with altered mental status. PMH is significant for ADHD.  Altered mental status AcuteEncephalopathy, improved  Likely psychiatric in origin, triggered by THC delta 10 use. Patient continues to have delusions, pressured speech, and grandiose ideas.  -Psychiatry consulted appreciate recommendations -Continuous pulse ox and cardiac monitoring -Follow-up blood drug screen -Follow-up urine and blood cultures- NGTD  -mIVF 150 mL/h - Tylenol 650 q 6prn  - Haldol 1mg  BID - Haldol 2mg  prn for agitation - Ativan 2mg  prn agitation  - F/u psych   Elevated CK CK downtrending at 1029. Could be elevated due to recent THC/drug use as well as antipsychotics given.  -Continue to monitor and trend until decreasing  - mIVF - f/u am CK   Hypokalemia Potassium 3.5 -am BMP -Replete as appropriate  Leukocytosis  WBC 11.9 likely in the setting of a stress reaction due to his psychosis as well as THC use - Continue to monitor   [facility] Use  UDS admission positive for THC.  Patient mentioned taking delta 10 THC to neurology during admission. - encourage cessation  ADHD Per chart review. No medications on chart.  FEN/GI: Regular diet Prophylaxis:SCDs   Status is: Inpatient  Remains inpatient appropriate because:Ongoing diagnostic testing needed not appropriate for outpatient work up and Unsafe d/c plan   Dispo:             Patient From: Home             Planned Disposition: Home             Medically stable for discharge: No  Subjective:  No acute  events overnight. Patient sitting on the side of the bed this morning talking to another staff member in the room. Discussed at length yesterday afternoon his situation with his mom and girlfriend in the room and dad on the phone. We will swing by his room again this afternoon.   Objective: Temp:  [97.9 F (36.6 C)-98.5 F (36.9 C)] 98 F (36.7 C) (03/27 1949) Pulse Rate:  [83-102] 83 (03/27 1949) Resp:  [18] 18 (03/27 1949) BP: (136-158)/(83-94) 146/94 (03/27 1949) SpO2:  [99 %-100 %] 99 % (03/27 1949) Physical Exam: General: alert, sitting in bed, NAD Respiratory: normal WOB Abdomen: soft, non distended Extremities: moving spontaneously   Laboratory: Recent Labs  Lab 06/25/20 0651 06/25/20 0955 06/26/20 0207 06/27/20 0141  WBC 15.4*  --  10.6* 11.1*  HGB 16.0 16.3 14.3 14.2  HCT 47.9 48.0 39.5 41.5  PLT 399  --  283 325   Recent Labs  Lab 06/25/20 0651 06/25/20 0955 06/26/20 0207 06/26/20 1441 06/27/20 0141  NA 136   < > 136 137 136  K 3.3*   < > 3.2* 3.5 3.4*  CL 102   < > 105 104 106  CO2 17*   < > 20* 20* 19*  BUN 10   < > 7 <5* 6  CREATININE 0.99   < > 0.78 0.76 0.66  CALCIUM 9.5   < > 8.6* 9.1 8.9  PROT 8.6*  --  7.0  --   --   BILITOT 1.4*  --  1.3*  1.4*  --   --   ALKPHOS 81  --  64  --   --   ALT 25  --  27  --   --   AST 32  --  39  --   --   GLUCOSE 88   < > 96 88 91   < > = values in this interval not displayed.    Imaging/Diagnostic Tests: None new  Donald Collum, DO 06/27/2020, 10:36 PM PGY-1, [facility] FPTS Intern pager: [phone], text pages welcome

## 2020-06-27 NOTE — Progress Notes (Signed)
Family Medicine Teaching Service Daily Progress Note Intern Pager: [phone]  Patient name: Donald Curry Medical record number: [id number] Date of birth: 23-Nov-1995 Age: 25 y.o. Gender: male  Primary Care Provider: Patient, No Pcp Per Consultants: Psych, Neuro (s/o) Code Status: Full   Pt Overview and Major Events to Date:  3/25 - Admitted, IVC placed  Assessment and Plan: Donald Curry a 24 y.o.malepresenting with altered mental status. PMH is significant for ADHD.  Altered mental status AcuteEncephalopathy, improved  Likely psychiatric in origin, triggered by THC delta 10 use.  -Psychiatry consulted appreciate recommendations -Continuous pulse ox and cardiac monitoring -Follow-up blood drug screen -Follow-up urine and blood cultures -Continue 1.5 mIVF @ 244ml/h - Haldol and Ativan prn for agitation  - F/u psych   Elevated CK Elevated Ck of 1154 this am. Could be elevated due to recent THC/drug use as well as antipsychotics given. Will check again this afternoon and possibly decrease fluids  -Continue to monitor and trend until decreasing  - Continue 1.5 mIVF @ 250ml/h  - f/u CK in pm    Hypokalemia Potassium 3.4 this am. Replenished with KCl.  -am BMP -Replete as appropriate  THC Use  UDS admission positive for THC.  Patient mentioned taking delta 10 THC to neurology during admission. - encourage cessation  ADHD Per chart review. No medications on chart.  FEN/GI: Regular diet Prophylaxis:SCDs   Status is: Inpatient  Remains inpatient appropriate because:Ongoing diagnostic testing needed not appropriate for outpatient work up and Unsafe d/c plan   Dispo:             Patient From: Home             Planned Disposition: Home             Medically stable for discharge: No                Subjective:  No acute events overnight. Observed patient laying in bed reading. Will check on patient again this afternoon when his family is  there and will update them as well.   Objective: Temp:  [98.3 F (36.8 C)-99.8 F (37.7 C)] 98.3 F (36.8 C) (03/27 0451) Pulse Rate:  [90-102] 90 (03/27 0451) Resp:  [17-19] 18 (03/27 0451) BP: (140-162)/(83-98) 144/83 (03/27 0451) SpO2:  [96 %-100 %] 100 % (03/27 0451) Physical Exam: General: alert, reading in bed, NAD Respiratory: Normal WOB Abdomen: non -distended Extremities: moving spontaneously   Laboratory: Recent Labs  Lab 06/25/20 0651 06/25/20 0955 06/26/20 0207 06/27/20 0141  WBC 15.4*  --  10.6* 11.1*  HGB 16.0 16.3 14.3 14.2  HCT 47.9 48.0 39.5 41.5  PLT 399  --  283 325   Recent Labs  Lab 06/25/20 0651 06/25/20 0955 06/26/20 0207 06/26/20 1441 06/27/20 0141  NA 136   < > 136 137 136  K 3.3*   < > 3.2* 3.5 3.4*  CL 102   < > 105 104 106  CO2 17*   < > 20* 20* 19*  BUN 10   < > 7 <5* 6  CREATININE 0.99   < > 0.78 0.76 0.66  CALCIUM 9.5   < > 8.6* 9.1 8.9  PROT 8.6*  --  7.0  --   --   BILITOT 1.4*  --  1.3*  1.4*  --   --   ALKPHOS 81  --  64  --   --   ALT 25  --  27  --   --  AST 32  --  39  --   --   GLUCOSE 88   < > 96 88 91   < > = values in this interval not displayed.    Imaging/Diagnostic Tests: MR BRAIN W WO CONTRAST  Result Date: 06/26/2020 CLINICAL DATA:  Altered mental status EXAM: MRI HEAD WITHOUT AND WITH CONTRAST TECHNIQUE: Multiplanar, multiecho pulse sequences of the brain and surrounding structures were obtained without and with intravenous contrast. CONTRAST:  73mL GADAVIST GADOBUTROL 1 MMOL/ML IV SOLN COMPARISON:  None. FINDINGS: Brain: There is no acute infarction or intracranial hemorrhage. There is no intracranial mass, mass effect, or edema. There is no hydrocephalus or extra-axial fluid collection. Ventricles and sulci are normal in size and configuration. No abnormal enhancement. Vascular: Major vessel flow voids at the skull base are preserved. Skull and upper cervical spine: Normal marrow signal is preserved.  Sinuses/Orbits: Minor mucosal thickening.  Orbits are unremarkable. Other: Sella is unremarkable. Mastoid air cells are clear. Incidental small retention or Tornwaldt cyst the posterior nasopharyngeal wall. IMPRESSION: No significant abnormality. Electronically Signed   By: Guadlupe Spanish M.D.   On: 06/26/2020 14:26   Cora Collum, DO 06/27/2020, 8:33 AM PGY-1, [facility] FPTS Intern pager: [phone], text pages welcome

## 2020-06-27 NOTE — Consult Note (Signed)
[facility] Face-to-Face Psychiatry Consult   Reason for Consult:  Acute psychosis Referring Physician:  Dr Melba Coon Patient Identification: Donald Curry MRN:  [id number] Principal Diagnosis: AMS Diagnosis:  Principal Problem:   Cannabis-induced mood disorder (HCC) Active Problems:   Encephalitis   AMS (altered mental status)   Total Time spent with patient: 45 minutes  Subjective:   Donald Curry is a 25 y.o. male patient admitted with aggression, confusion, AMS; psych consult for evaluation of acute psychosis.  Patient seen and evaluated in person by this provider with mother at his bedside, patient agreeable for her to be part of the assessment.  He states, "I feel better.  I couldn't get it figured out until I talked to myself about it last night."  Paranoid with the RN providing his potassium.  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  Mother reports his sister has a history of depression and anxiety, not bipolar d/o.  Infection cleared by MD.  Client resistant to taking haldol to assist with the Delta 10 clearing.  Explained what the medication was and how it would help, mother also encouraged him.  Client agreeable and relieved this is not a lifetime medication.  Haldol 2 mg at bedtime changed to 1 mg BID so he can get some benefit now.  Recommend Ativan PRN as ordered and refrain from benadryl administration.  06/26/20: Patient seen and evaluated in person by this provider.  Dishelved with odd behaviors at times, difficulty processing information on occasion in the interview.  Difficult remembering the reason he was brought to the ED and states, "I was acting not in time but in time the whole time."  Denies auditory and visual hallucinations, paranoia, depression, mania symptoms in the past.  Appetite is "good", sleep is great.  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  Rambles about edibles and making them for others and not using.  When asked if his behavior changed when he used Delta 10, "I had big behavior changes."  Educated on the use of Delta 8 and 10 with adverse effects of psychosis which this provider has seen frequently recently in the EDs and child/adolescent with increases in anxiety and triggers of psychosis.  He is agreeable for his family to remove these substances, sister at this bedside.  Caveat:  His sister does have bipolar and this could be his first episode, triggered by the cannabis.  He also has an infection, treating with vancomycin.  HPI per neurologist:  Donald Curry is a 25 y.o. male with no pertinent medical history who presented to the ED today after he was -brought by his father for concerns of agitation and aggression. His girlfriend noticed last night that he was acting strangely and called Mr. Brookens father for help when he went to take the dog for a walk in the early morning hours and returned home without the dog. Mr. Marney continued acting confused and aggressive for his father so he was brought to the ED for further evaluation. In the ED, he was verbally aggressive with tachycardia, low grade temperature, with tachypnea and an elevated venous lactic acid.  EDP was concerned for infectious process as cause of presentation. His UDS was also positive for THC. He required Geodon, Haldol, Benadryl, Ativan, and Versed in the ED for aggression and agitation. He was taken for fluoro-guided LP which was unsuccessful due to patient movements and contaminating the sterile field.   On examination today patient has disorganized thoughts and pressured speech with mood lability. Some grandiosity is noted  as well. For example he references "having no weaknesses" and being "the biggest and smallest college and went to school for clowning everyone". He claims he is in college but finished Electrical engineer college" and got his PhD online "in everything" and then when asked the specific subject, proceeds to list "mathematics, chemistry, biology, and physics", then states he wants to see his phone to be able to tell examiners the rest of the subjects he obtained a PhD in. Patient also begins to talk about believing that "someone is cheating", stating that the identity is someone named "father" and when asked if it was his father or someone named father he states "yeah, I guess my father". He then goes on to state that his father is cheating on his mother and later states that his father "plays on his cheating device" while mimicking someone tapping away at a smartphone. Also endorses talking to "false prophets" today and describes them as wearing doctor's clothing and masks, but they were "lying to me" which he sensed because they did not give him good eye contact. He endorses using a drug called "Delta-10" that he got from "some place called [place]" because it "keeps you up, makes you feel smart, and you are on autopilot and already know what is going to happen". Intermittently becomes agitated stating that him being "stuck in the hospital" is "my father's fault" but also states that he feels like he is "stuck in [employer]" when it is "sometimes my fault". He frequently becomes agitated with some of the questions asked He claims that "there is a bug that needs to be rectified". He has paranoid thoughts as well claiming that he has heard others speak behind his back about "his slowed thoughts" and states that he has "been beat up" with "people talking behind my back". Also endorses visual hallucinations during assessment pointing to the ceiling while stating that he is seeing a bug running on the ceiling. States that he has a  website that nobody is supposed to know about and he thinks it is connected to another website that he is "not even allowed to access": [email or web address].   Past Psychiatric History: none  Risk to Self:  potentially related to his current state Risk to Others:  aggressive at times Prior Inpatient Therapy:  none Prior Outpatient Therapy:  none  Past Medical History:  Past Medical History:  Diagnosis Date  . Obesity    History reviewed. No pertinent surgical history. Family History:  Family History  Problem Relation Age of Onset  . Mood Disorder Sister        per mother, patient's sister with history of ? bipolar, SI, 2X suicide attempts    Family Psychiatric  History: sister with bipolar d/o Social History:  Social History   Substance and Sexual Activity  Alcohol Use Yes     Social History   Substance and Sexual Activity  Drug Use Not on file    Social History   Socioeconomic History  . Marital status: Single    Spouse name: Not on file  . Number of children: Not  on file  . Years of education: Not on file  . Highest education level: Not on file  Occupational History  . Not on file  Tobacco Use  . Smoking status: Not on file  . Smokeless tobacco: Not on file  Substance and Sexual Activity  . Alcohol use: Yes  . Drug use: Not on file  . Sexual activity: Not on file  Other Topics Concern  . 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   Has a Social research officer, [employer]   Graduated with masters in 3.5 years    Social Determinants of Corporate investment bankerHealth   Financial Resource Strain: Not on file  Food Insecurity: Not on file  Transportation Needs: Not on file  Physical Activity: Not on file  Stress: Not on file  Social Connections: Not on file   Additional Social History:    Allergies:  No Known Allergies  Labs:  Results for orders placed or performed  during the hospital encounter of 06/25/20 (from the past 48 hour(s))  RPR     Status: None   Collection Time: 06/25/20  1:28 PM  Result Value Ref Range   RPR Ser Ql NON REACTIVE NON REACTIVE    Comment: Performed at [facility], [address] [place], [place] [address]  Blood gas, venous     Status: Abnormal   Collection Time: 06/25/20  4:14 PM  Result Value Ref Range   pH, Ven 7.387 7.250 - 7.430   pCO2, Ven 36.2 (L) 44.0 - 60.0 mmHg   pO2, Ven 88.5 (H) 32.0 - 45.0 mmHg   Bicarbonate 21.3 20.0 - 28.0 mmol/L   Acid-base deficit 2.9 (H) 0.0 - 2.0 mmol/L   O2 Saturation 96.5 %   Patient temperature 37.0    Drawn by 164    Sample type VENOUS     Comment: Performed at [facility], [address] [place], [place] [address]  HIV Antibody (routine testing w rflx)     Status: None   Collection Time: 06/25/20  4:14 PM  Result Value Ref Range   HIV Screen 4th Generation wRfx Non Reactive Non Reactive    Comment: Performed at [facility], [address] [place], [place] [address]  CK     Status: Abnormal   Collection Time: 06/25/20  4:14 PM  Result Value Ref Range   Total CK 1,130 (H) 49 - 397 U/L    Comment: Performed at [facility], [address] [place], [place] [address]  TSH     Status: None   Collection Time: 06/25/20  4:14 PM  Result Value Ref Range   TSH 2.720 0.350 - 4.500 uIU/mL    Comment: Performed by a 3rd Generation assay with a functional sensitivity of <=0.01 uIU/mL. Performed at [facility], [address] [place], [place] [address]   Ethanol     Status: None   Collection Time: 06/25/20  4:14 PM  Result Value Ref Range   Alcohol, Ethyl (B) <10 <10 mg/dL    Comment: (NOTE) Lowest detectable limit for serum alcohol is 10 mg/dL.  For medical purposes only. Performed at [facility], [address] [place], [place] [address]   Basic metabolic panel     Status: Abnormal   Collection Time: 06/25/20  8:27 PM  Result  Value Ref Range   Sodium 137 135 - 145 mmol/L   Potassium 3.3 (L) 3.5 - 5.1 mmol/L   Chloride 104 98 - 111 mmol/L   CO2 24 22 - 32 mmol/L   Glucose, Bld 104 (H) 70 - 99 mg/dL    Comment: Glucose reference range applies only to samples taken after fasting for at least 8 hours.   BUN 8 6 - 20 mg/dL   Creatinine, Ser 4.76 0.61 - 1.24 mg/dL   Calcium 8.8 (L) 8.9 - 10.3 mg/dL   GFR, Estimated >54 >65 mL/min    Comment: (NOTE) Calculated using the CKD-EPI Creatinine Equation (2021)    Anion gap 9 5 - 15    Comment: Performed at [facility], [address] [place], [place] [address]  Lactic acid, plasma     Status: None   Collection Time: 06/25/20  8:27 PM  Result Value Ref Range   Lactic Acid, Venous 0.9 0.5 - 1.9 mmol/L    Comment: Performed at [facility], [address], [place], [place] [address]  CBC with Differential/Platelet     Status: Abnormal   Collection Time: 06/26/20  2:07 AM  Result Value Ref Range   WBC 10.6 (H) 4.0 - 10.5 K/uL   RBC 4.75 4.22 - 5.81 MIL/uL   Hemoglobin 14.3 13.0 - 17.0 g/dL   HCT 75.1 70.0 - 17.4 %   MCV 83.2 80.0 - 100.0 fL   MCH 30.1 26.0 - 34.0 pg   MCHC 36.2 (H) 30.0 - 36.0 g/dL   RDW 94.4 96.7 - 59.1 %   Platelets 283 150 - 400 K/uL    Comment: SPECIMEN CHECKED FOR CLOTS REPEATED TO VERIFY PLATELET CLUMPS NOTED ON SMEAR, COUNT APPEARS ADEQUATE    nRBC 0.0 0.0 - 0.2 %   Neutrophils Relative % 57 %   Neutro Abs 6.0 1.7 - 7.7 K/uL   Lymphocytes Relative 32 %   Lymphs Abs 3.4 0.7 - 4.0 K/uL   Monocytes Relative 10 %   Monocytes Absolute 1.1 (H) 0.1 - 1.0 K/uL   Eosinophils Relative 1 %   Eosinophils Absolute 0.1 0.0 - 0.5 K/uL   Basophils Relative 0 %   Basophils Absolute 0.0 0.0 - 0.1 K/uL   Immature Granulocytes 0 %   Abs Immature Granulocytes 0.03 0.00 - 0.07 K/uL    Comment: Performed at [facility], [address] [place], [place] [address]  Bilirubin, fractionated(tot/dir/indir)     Status: Abnormal    Collection Time: 06/26/20  2:07 AM  Result Value Ref Range   Total Bilirubin 1.3 (H) 0.3 - 1.2 mg/dL   Bilirubin, Direct 0.2 0.0 - 0.2 mg/dL   Indirect Bilirubin 1.1 (H) 0.3 - 0.9 mg/dL    Comment: Performed at [facility], [address] [place], [place] [address]  CK     Status: Abnormal   Collection Time: 06/26/20  2:07 AM  Result Value Ref Range   Total CK 1,446 (H) 49 - 397 U/L    Comment: Performed at [facility], [address], [place], [place] [address]  Comprehensive metabolic panel     Status: Abnormal   Collection Time: 06/26/20  2:07 AM  Result Value Ref Range   Sodium 136 135 - 145 mmol/L   Potassium 3.2 (L) 3.5 - 5.1 mmol/L   Chloride 105 98 - 111 mmol/L   CO2 20 (L) 22 - 32 mmol/L   Glucose,  Bld 96 70 - 99 mg/dL    Comment: Glucose reference range applies only to samples taken after fasting for at least 8 hours.   BUN 7 6 - 20 mg/dL   Creatinine, Ser 1.61 0.61 - 1.24 mg/dL   Calcium 8.6 (L) 8.9 - 10.3 mg/dL   Total Protein 7.0 6.5 - 8.1 g/dL   Albumin 3.9 3.5 - 5.0 g/dL   AST 39 15 - 41 U/L   ALT 27 0 - 44 U/L   Alkaline Phosphatase 64 38 - 126 U/L   Total Bilirubin 1.4 (H) 0.3 - 1.2 mg/dL   GFR, Estimated >09 >60 mL/min    Comment: (NOTE) Calculated using the CKD-EPI Creatinine Equation (2021)    Anion gap 11 5 - 15    Comment: Performed at [facility], [address], [place], [place] [address]  Basic metabolic panel     Status: Abnormal   Collection Time: 06/26/20  2:41 PM  Result Value Ref Range   Sodium 137 135 - 145 mmol/L   Potassium 3.5 3.5 - 5.1 mmol/L   Chloride 104 98 - 111 mmol/L   CO2 20 (L) 22 - 32 mmol/L   Glucose, Bld 88 70 - 99 mg/dL    Comment: Glucose reference range applies only to samples taken after fasting for at least 8 hours.   BUN <5 (L) 6 - 20 mg/dL   Creatinine, Ser 8.11 0.61 - 1.24 mg/dL   Calcium 9.1 8.9 - 91.4 mg/dL   GFR, Estimated >78 >29 mL/min    Comment: (NOTE) Calculated using the CKD-EPI  Creatinine Equation (2021)    Anion gap 13 5 - 15    Comment: Performed at [facility], [address] [place], [place] [address]  CK     Status: Abnormal   Collection Time: 06/26/20  2:41 PM  Result Value Ref Range   Total CK 1,471 (H) 49 - 397 U/L    Comment: Performed at [facility], [address], [place], [place] [address]  CK     Status: Abnormal   Collection Time: 06/27/20  1:41 AM  Result Value Ref Range   Total CK 1,154 (H) 49 - 397 U/L    Comment: Performed at [facility], [address], [place], [place] [address]  CBC     Status: Abnormal   Collection Time: 06/27/20  1:41 AM  Result Value Ref Range   WBC 11.1 (H) 4.0 - 10.5 K/uL   RBC 4.81 4.22 - 5.81 MIL/uL   Hemoglobin 14.2 13.0 - 17.0 g/dL   HCT 29.5 28.4 - 13.2 %   MCV 86.3 80.0 - 100.0 fL   MCH 29.5 26.0 - 34.0 pg   MCHC 34.2 30.0 - 36.0 g/dL   RDW 44.0 10.2 - 72.5 %   Platelets 325 150 - 400 K/uL   nRBC 0.0 0.0 - 0.2 %    Comment: Performed at [facility], [address], [place], [place] [address]  Basic metabolic panel     Status: Abnormal   Collection Time: 06/27/20  1:41 AM  Result Value Ref Range   Sodium 136 135 - 145 mmol/L   Potassium 3.4 (L) 3.5 - 5.1 mmol/L   Chloride 106 98 - 111 mmol/L   CO2 19 (L) 22 - 32 mmol/L   Glucose, Bld 91 70 - 99 mg/dL    Comment: Glucose reference range applies only to samples taken after fasting for  at least 8 hours.   BUN 6 6 - 20 mg/dL   Creatinine, Ser 1.61 0.61 - 1.24 mg/dL   Calcium 8.9 8.9 - 09.6 mg/dL   GFR, Estimated >04 >54 mL/min    Comment: (NOTE) Calculated using the CKD-EPI Creatinine Equation (2021)    Anion gap 11 5 - 15    Comment: Performed at [facility], [address], [place], [place] [address]    Current Facility-Administered Medications  Medication Dose Route Frequency Provider Last Rate Last Admin  . 0.9 %  sodium chloride infusion   Intravenous Continuous Espinoza, Alejandra, DO 200 mL/hr at  06/27/20 0506 New Bag at 06/27/20 0506  . acetaminophen (TYLENOL) tablet 650 mg  650 mg Oral Q6H PRN Cora Collum, DO       Or  . acetaminophen (TYLENOL) suppository 650 mg  650 mg Rectal Q6H PRN Paige, Victoria J, DO      . diphenhydrAMINE (BENADRYL) capsule 50 mg  50 mg Oral Once PRN Lilland, Alana, DO       Or  . diphenhydrAMINE (BENADRYL) injection 50 mg  50 mg Intramuscular Once PRN Lilland, Alana, DO      . enoxaparin (LOVENOX) injection 40 mg  40 mg Subcutaneous Q24H Lilland, Alana, DO   40 mg at 06/26/20 1651  . haloperidol (HALDOL) tablet 1 mg  1 mg Oral BID Charm Rings, NP      . haloperidol (HALDOL) tablet 2 mg  2 mg Oral Once PRN Lilland, Alana, DO       Or  . haloperidol lactate (HALDOL) injection 2 mg  2 mg Intramuscular Once PRN Lilland, Alana, DO      . LORazepam (ATIVAN) injection 2 mg  2 mg Intravenous PRN Lilland, Alana, DO        Musculoskeletal: Strength & Muscle Tone: within normal limits Gait & Station: did not witness Patient leans: N/A            Psychiatric Specialty Exam:  Presentation  General Appearance: Casual  Eye Contact:Fair  Speech:Clear and Coherent  Speech Volume:Normal  Handedness:Right   Mood and Affect  Mood:Anxious; Euphoric  Affect:Blunt   Thought Process  Thought Processes:Other (comment)  Descriptions of Associations:Tangential  Orientation:Full (Time, Place and Person)  Thought Content:Delusions  History of Schizophrenia/Schizoaffective disorder:No  Duration of Psychotic Symptoms:Less than six months  Hallucinations:Hallucinations: None  Ideas of Reference:Delusions  Suicidal Thoughts:Suicidal Thoughts: No  Homicidal Thoughts:Homicidal Thoughts: No   Sensorium  Memory:Immediate Fair; Recent Poor; Remote Fair  Judgment:Impaired  Insight:Lacking   Executive Functions  Concentration:Fair  Attention Span:Poor  Recall:Fair  Fund of Knowledge:Good  Language:Good   Psychomotor  Activity  Psychomotor Activity:Psychomotor Activity: Normal   Assets  Assets:Desire for Improvement; Manufacturing systems engineer; Housing; Intimacy; Leisure Time; Resilience; Social Support; Talents/Skills; Vocational/Educational   Sleep  Sleep:Sleep: Good   Physical Exam: Physical Exam Vitals and nursing note reviewed.  Constitutional:      Appearance: Normal appearance.  HENT:     Head: Normocephalic.     Nose: Nose normal.  Pulmonary:     Effort: Pulmonary effort is normal.  Musculoskeletal:        General: Normal range of motion.  Neurological:     General: No focal deficit present.     Mental Status: He is alert and oriented to person, place, and time.  Psychiatric:        Attention and Perception: Attention normal.        Mood and Affect: Mood  is anxious. Affect is blunt.        Speech: Speech normal.        Behavior: Behavior is cooperative.        Thought Content: Thought content is delusional.        Cognition and Memory: Cognition is impaired. Memory is impaired.        Judgment: Judgment normal.    Review of Systems  Psychiatric/Behavioral: Positive for substance abuse. The patient is nervous/anxious.   All other systems reviewed and are negative.  Blood pressure (!) 144/83, pulse 90, temperature 98.3 F (36.8 C), temperature source Oral, resp. rate 18, height 6\' 1"  (1.854 m), weight 135.1 kg, SpO2 100 %. Body mass index is 39.29 kg/m.  Treatment Plan Summary: Cannabis induced mood disorder vs bipolar disorder vs infection: -Change Haldol 2 mg at bedtime to 1 mg BID  Agitation: -recommend Haldol 5 mg/Ativan 2 mg oral or IM  Disposition: Supportive therapy provided about ongoing stressors. re-evaluate tomorrow  , NP 06/27/2020 12:39 PM

## 2020-06-28 DIAGNOSIS — F12988 Cannabis use, unspecified with other cannabis-induced disorder: Secondary | ICD-10-CM | POA: Diagnosis not present

## 2020-06-28 DIAGNOSIS — F39 Unspecified mood [affective] disorder: Secondary | ICD-10-CM | POA: Diagnosis not present

## 2020-06-28 LAB — CBC
HCT: 40.8 % (ref 39.0–52.0)
Hemoglobin: 14.2 g/dL (ref 13.0–17.0)
MCH: 30 pg (ref 26.0–34.0)
MCHC: 34.8 g/dL (ref 30.0–36.0)
MCV: 86.1 fL (ref 80.0–100.0)
Platelets: 327 10*3/uL (ref 150–400)
RBC: 4.74 MIL/uL (ref 4.22–5.81)
RDW: 12 % (ref 11.5–15.5)
WBC: 11.9 10*3/uL — ABNORMAL HIGH (ref 4.0–10.5)
nRBC: 0 % (ref 0.0–0.2)

## 2020-06-28 LAB — BASIC METABOLIC PANEL
Anion gap: 7 (ref 5–15)
BUN: 5 mg/dL — ABNORMAL LOW (ref 6–20)
CO2: 23 mmol/L (ref 22–32)
Calcium: 9 mg/dL (ref 8.9–10.3)
Chloride: 107 mmol/L (ref 98–111)
Creatinine, Ser: 0.73 mg/dL (ref 0.61–1.24)
GFR, Estimated: >60 mL/min (ref >60)
Glucose, Bld: 87 mg/dL (ref 70–99)
Potassium: 3.5 mmol/L (ref 3.5–5.1)
Sodium: 137 mmol/L (ref 135–145)

## 2020-06-28 LAB — CULTURE, BLOOD (ROUTINE X 2): Special Requests: ADEQUATE

## 2020-06-28 LAB — CK: Total CK: 1029 U/L — ABNORMAL HIGH (ref 49–397)

## 2020-06-28 MED ORDER — HALOPERIDOL 0.5 MG PO TABS
1.0000 mg | ORAL_TABLET | Freq: Two times a day (BID) | ORAL | Status: DC
  Administered 2020-06-28 – 2020-06-29 (×3): 1 mg via ORAL
  Filled 2020-06-28 (×3): qty 2

## 2020-06-28 MED ORDER — ENOXAPARIN SODIUM 60 MG/0.6ML ~~LOC~~ SOLN
60.0000 mg | SUBCUTANEOUS | Status: DC
  Administered 2020-06-28 – 2020-07-03 (×6): 60 mg via SUBCUTANEOUS
  Filled 2020-06-28 (×6): qty 0.6

## 2020-06-28 MED ORDER — POLYETHYLENE GLYCOL 3350 17 G PO PACK
17.0000 g | PACK | Freq: Every day | ORAL | Status: DC
  Filled 2020-06-28 (×4): qty 1

## 2020-06-28 MED ORDER — DIPHENHYDRAMINE HCL 50 MG/ML IJ SOLN
25.0000 mg | Freq: Once | INTRAMUSCULAR | Status: DC
  Filled 2020-06-28: qty 1

## 2020-06-28 MED ORDER — HALOPERIDOL LACTATE 5 MG/ML IJ SOLN
1.0000 mg | Freq: Two times a day (BID) | INTRAMUSCULAR | Status: DC
  Administered 2020-06-29: 1 mg via INTRAMUSCULAR
  Filled 2020-06-28: qty 1

## 2020-06-28 MED ORDER — HALOPERIDOL LACTATE 5 MG/ML IJ SOLN
5.0000 mg | Freq: Once | INTRAMUSCULAR | Status: DC
  Filled 2020-06-28: qty 1

## 2020-06-28 MED ORDER — HALOPERIDOL 5 MG PO TABS
5.0000 mg | ORAL_TABLET | Freq: Once | ORAL | Status: AC
  Administered 2020-06-28: 5 mg via ORAL
  Filled 2020-06-28: qty 1

## 2020-06-28 NOTE — Progress Notes (Addendum)
Family Medicine Teaching Service Daily Progress Note Intern Pager: [phone]  Patient name: Donald Curry Medical record number: [id number] Date of birth: [birth year] Age: 25 y.o. Gender: male  Primary Care Provider: Patient, No Pcp Per Consultants: Psych, Neuro (s/o)  Code Status: Full   Pt Overview and Major Events to Date:  3/25 - Admitted, IVC placed  Assessment and Plan: Donald Curry a 24 y.o.malepresenting with altered mental status. PMH is significant for ADHD.  Altered mental status AcuteEncephalopathy Likely psychiatric in origin, triggered by THC/drug use. Last night patient was agitated, aggressive, spitting. Received prn Haldol. Placed on restraints and was ultimately able to calm and have them removed.  -Psychiatry consulted appreciate recommendations -Continuous pulse ox and cardiac monitoring -Follow-up blood drug screen -Follow-up urine and blood cultures -IV fluids 200 mL/h - Tylenol 650 q 6prn  - Haldol 1mg  BID - Haldol 5mg  prn for agitation - Ativan 2mg  prn agitation  - F/u psych   Tachycardia  Patient's heart rate went up to 150 last night. On evaluation patient was shaking his arms and questioning the rate being falsely elevated. On recheck about 130bpm. Repeat EKG at 10pm shows sinus tach 124 bpm. Denies difficulty breathing.  - continue to monitor - consider d dimer  Elevated CK CK continuing to increase at 1756. Likely increasing due him losing IV and not getting fluid as well as due to recent THC/drug and antipsychotics given. Talked to patient this am and after a lot of push back because it was "taking away his free will" he was amenable to replacing his IV.  -Continue to monitor and trend until decreasing  - f/u am CK  - replaced IV and restarted IV fluids at 239ml/h - 500 cc bolus   Hypokalemia Potassium 3.5 yesterday. Not checked today because lost IV  -am BMP -Replete as appropriate  Leukocytosis  WBC 11.9 yesterday likely in  the setting of a stress reaction due to his psychosis as well as THC use - Continue to monitor   [facility] Use  UDS admission positive for THC.  Patient mentioned taking delta 10 THC to neurology during admission. - encourage cessation  ADHD Per chart review. No medications on chart.  FEN/GI: Regular diet Prophylaxis:SCDs   Status is: Inpatient  Remains inpatient appropriate because:Ongoing diagnostic testing needed not appropriate for outpatient work up and Unsafe d/c plan  Dispo:             Patient From: Home             Planned Disposition: Home             Medically stable for discharge: No  Subjective:  Overnight patient was very agitated, pulled out IV, was spitting and ultimately needed prn Haldol and was placed in restraints. This morning he expresses remorse for everyone affected. We discussed his elevated CK and the need for fluids and he states it was "taking away my free will" Ultimately he agreed to get IV replaced.                         Objective: Temp:  [97.9 F (36.6 C)-99 F (37.2 C)] 99 F (37.2 C) (03/28 2015) Pulse Rate:  [67-106] 106 (03/28 2015) Resp:  [18-20] 20 (03/28 2015) BP: (131-156)/(72-100) 139/93 (03/28 2148) SpO2:  [96 %-100 %] 96 % (03/28 2015) Physical Exam: General: laying in bed sweating, pressured speech Cardiovascular: RRR no murmrus Respiratory: CTAB normal WOB Abdomen: soft, non distended  Extremities: moving spontaneously Psych: mood anxious and speech pressured. Irritably but not aggressive  Laboratory: Recent Labs  Lab 06/26/20 0207 06/27/20 0141 06/28/20 0605  WBC 10.6* 11.1* 11.9*  HGB 14.3 14.2 14.2  HCT 39.5 41.5 40.8  PLT 283 325 327   Recent Labs  Lab 06/25/20 0651 06/25/20 0955 06/26/20 0207 06/26/20 1441 06/27/20 0141 06/28/20 0605  NA 136   < > 136 137 136 137  K 3.3*   < > 3.2* 3.5 3.4* 3.5  CL 102   < > 105 104 106 107  CO2 17*   < > 20* 20* 19* 23  BUN 10   < > 7 <5* 6 5*  CREATININE  0.99   < > 0.78 0.76 0.66 0.73  CALCIUM 9.5   < > 8.6* 9.1 8.9 9.0  PROT 8.6*  --  7.0  --   --   --   BILITOT 1.4*  --  1.3*  1.4*  --   --   --   ALKPHOS 81  --  64  --   --   --   ALT 25  --  27  --   --   --   AST 32  --  39  --   --   --   GLUCOSE 88   < > 96 88 91 87   < > = values in this interval not displayed.    Imaging/Diagnostic Tests: None new  Cora Collum, DO 06/28/2020, 10:48 PM PGY-1, [facility] Family Medicine FPTS Intern pager: [phone], text pages welcome

## 2020-06-28 NOTE — Progress Notes (Signed)
PT BECAME COMBATIVE UNDIRECTABLE WITH STAFF AND SECURITY. PULLED OUT IV, MAKING ATTEMPTS TO LEAVE THE UNIT AND TO GO HOME. SECURITY ARRIVED , RESTRAINED PT MANUALLY, HALDOL IM GIVEN FOLLOWED BY 5 POINT RESTRAINTS. REMAINED COMBATIVE IN BED, TEARING AWAY RESTRAINTS, ATIVAN IV GIVEN. FMTS PAGED AND ARRIVED TO FLOOR TO ASSESS.

## 2020-06-28 NOTE — Progress Notes (Signed)
Psychiatry Progress Note  06/28/2020 12:23 PM Donald Curry  MRN:  [id number]   Subjective: No acute overnight events Patient evaluated at bedside this morning, with sitter present in the room.  Patient denies any suicidal or homicidal ideations.  He states " waiting to propose my girlfriend she should be here soon" " my mother is outside" "" I am a clown" " Reuel Boom is a child". Of note sitter present at the bedside notified that patient only slept 2 hours last night and has been out of control whole night and this morning.  Principal Problem: Cannabis-induced mood disorder (HCC) Diagnosis: Principal Problem:   Cannabis-induced mood disorder (HCC) Active Problems:   Encephalitis   AMS (altered mental status)  Total Time spent with patient: 20 minutes  Past Psychiatric History: No known past psychiatric history  Past Medical History:  Past Medical History:  Diagnosis Date  . Obesity    History reviewed. No pertinent surgical history. Family History:  Family History  Problem Relation Age of Onset  . Mood Disorder Sister        per mother, patient's sister with history of ? bipolar, SI, 2X suicide attempts    Family Psychiatric  History: Sister has bipolar disorder Social History:  Social History   Substance and Sexual Activity  Alcohol Use Yes     Social History   Substance and Sexual Activity  Drug Use Not on file    Social History   Socioeconomic History  . Marital status: Single    Spouse name: Not on file  . Number of children: Not on file  . Years of education: Not on file  . Highest education level: Not on file  Occupational History  . Not on file  Tobacco Use  . Smoking status: Not on file  . Smokeless tobacco: Not on file  Substance and Sexual Activity  . Alcohol use: Yes  . Drug use: Not on file  . Sexual activity: Not on file  Other Topics Concern  . 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 0.9 %  sodium chloride infusion   Intravenous Continuous Shirlean Mylar, MD 150 mL/hr at 06/27/20 1900 New Bag at 06/27/20 1900  . acetaminophen (TYLENOL) tablet 650 mg  650 mg Oral Q6H PRN Cora Collum, DO       Or  . acetaminophen (TYLENOL) suppository 650 mg  650 mg Rectal Q6H PRN Idalia Needle, Victoria J, DO      . enoxaparin (LOVENOX) injection 60 mg  60 mg Subcutaneous Q24H Westley Chandler, MD      . haloperidol (HALDOL) tablet 1 mg  1 mg Oral BID Dagar, Geralynn Rile, MD  Or  . haloperidol lactate (HALDOL) injection 1 mg  1 mg Intramuscular BID Dagar, Geralynn Rile, MD      . haloperidol (HALDOL) tablet 2 mg  2 mg Oral Once PRN Lilland, Alana, DO       Or  . haloperidol lactate (HALDOL) injection 2 mg  2 mg Intramuscular Once PRN Lilland, Alana, DO      . LORazepam (ATIVAN) injection 2 mg  2 mg Intravenous PRN Lilland, Alana, DO      . polyethylene glycol (MIRALAX / GLYCOLAX) packet 17 g  17 g Oral Daily Sabino Dick, DO        Lab Results:  CBC Latest Ref Rng & Units 06/28/2020 06/27/2020 06/26/2020  WBC 4.0 - 10.5 K/uL 11.9(H) 11.1(H) 10.6(H)  Hemoglobin 13.0 - 17.0 g/dL 40.9 81.1 91.4  Hematocrit 39.0 - 52.0 % 40.8 41.5 39.5  Platelets 150 - 400 K/uL 327 325 283   BMP Latest Ref Rng & Units 06/28/2020 06/27/2020 06/26/2020  Glucose 70 - 99 mg/dL 87 91 88  BUN 6 - 20 mg/dL 5(L) 6 <7(W)   Creatinine 0.61 - 1.24 mg/dL 2.95 6.21 3.08  Sodium 135 - 145 mmol/L 137 136 137  Potassium 3.5 - 5.1 mmol/L 3.5 3.4(L) 3.5  Chloride 98 - 111 mmol/L 107 106 104  CO2 22 - 32 mmol/L 23 19(L) 20(L)  Calcium 8.9 - 10.3 mg/dL 9.0 8.9 9.1    Blood Alcohol level:  Lab Results  Component Value Date   ETH <10 06/25/2020   ETH <10 06/25/2020    Metabolic Disorder Labs: Lab Results  Component Value Date   HGBA1C 5.1 06/27/2020   MPG 99.67 06/27/2020   No results found for: PROLACTIN No results found for: CHOL, TRIG, HDL, CHOLHDL, VLDL, LDLCALC   Musculoskeletal: Strength & Muscle Tone: within normal limits Gait & Station: normal Patient leans: N/A  Psychiatric Specialty Exam: Physical Exam Vitals and nursing note reviewed.  Constitutional:      Appearance: He is obese.  HENT:     Head: Normocephalic and atraumatic.     Mouth/Throat:     Mouth: Mucous membranes are moist.  Skin:    General: Skin is dry.  Neurological:     Mental Status: He is alert and oriented to person, place, and time.     Review of Systems  Blood pressure (!) 150/94, pulse 86, temperature 98.2 F (36.8 C), temperature source Oral, resp. rate 20, height 6\' 1"  (1.854 m), weight 135.1 kg, SpO2 97 %.Body mass index is 39.29 kg/m.  General Appearance: Disheveled  Eye Contact:  Fair  Speech:  Pressured  Volume:  Normal  Mood:  Anxious and Irritable  Affect:  Non-Congruent  Thought Process:  Disorganized and Descriptions of Associations: Tangential  Orientation:  Full (Time, Place, and Person)  Thought Content:  Illogical and Hallucinations: Auditory Visual  Suicidal Thoughts:  No  Homicidal Thoughts:  No  Memory:  Unable to evaluate due to disorganized behavior  Judgement:  Impaired  Insight:  Lacking  Psychomotor Activity:  Increased  Concentration:  Concentration: Poor and Attention Span: Poor  Recall:  Poor  Fund of Knowledge:  Fair  Language:  Fair  Akathisia:  No  Handed:  Right  AIMS  (if indicated):     Assets:  Communication Skills Resilience Social Support  ADL's:  Intact  Cognition:  WNL  Sleep:      Assessment: 25 year old with with no known past psychiatric history with aggression, confusion and altered mental status. 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  Nonredirectable, has flight of ideas, grandiose, unable to assess whether responding to internal stimuli or not as unable to engage him at this time.  Sitter at the bedside informs that he is responding to the stimuli. -EEG on 06/25/2020 QTC of 495, pending today.  CK level is 1029. -Patient is definitely in need of higher doses of antipsychotic to help with his disorganized speech and behavior but due to elevated CK level it would be unwise to use higher dose at this moment.  Treatment Plan Summary: --Continue Haldol 1 mg p.o. or IM daily for ongoing psychosis --Continue 1:1 sitter --Daily contact with patient to assess and evaluate symptoms and progress in treatment --Psychiatry will continue to follow  Arnoldo Lenis, MD 06/28/2020, 12:23 PM  PGY-1, Resident
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
## 2020-06-28 NOTE — Progress Notes (Signed)
Interim Progress Note:   Prior to reevaluating patient, received page from RN that he was resting comfortably and so had not administered the Haldol/Benadryl yet.  She additionally received a call from cardiac monitoring noting that his heart rate went to 150 bpm.  She states that when she went in to evaluate he was shaking both his arms and questions if this was falsely elevating the rate via CM.  Heart rate on her check around 130bpm.  EKG requested.  On evaluation of patient, he appears in no acute distress laying in bed.  EKG being completed upon our arrival.  States he is just trying to get a good nights rest.  In reference to his actions earlier this evening, he states "oh, that was a mistake, I am so sorry." "I did not mean for that to occur, it is all about [employer], it's always about her." "I just want to go see her and marry her."  He states that he understands that he needs to keep himself and the staff safe.  He denies any homicidal/suicidal ideations and states he does not have any intent to harm himself or others again.   Reviewed EKG, similar to previous showing sinus tachycardia around 124 bpm.  QTc 478.  Earlier in admission he was persistently tachycardic (sinus) to around 120--150 bpm.  Asymptomatic and breathing comfortably.  Assessment/plan: Overall, able to more clearly follow logical conversation and expresses remorse/understanding of his recent actions.  At this time will trial off restraints as currently he does not appear to be a harm to himself or others.   Restraints removed by RN around approximately 2220.  Patient remained calm during removal and afterwards.  Safety sitter with security still in place.  RN aware to use Haldol 5 mg and Benadryl 25 mg IM if agitation arises.  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  Will try to replace PIV to restart IVF.  Allayne Stack, DO
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
## 2020-06-29 DIAGNOSIS — F12988 Cannabis use, unspecified with other cannabis-induced disorder: Secondary | ICD-10-CM | POA: Diagnosis not present

## 2020-06-29 DIAGNOSIS — F39 Unspecified mood [affective] disorder: Secondary | ICD-10-CM | POA: Diagnosis not present

## 2020-06-29 LAB — BASIC METABOLIC PANEL
Anion gap: 11 (ref 5–15)
BUN: 5 mg/dL — ABNORMAL LOW (ref 6–20)
CO2: 23 mmol/L (ref 22–32)
Calcium: 8.9 mg/dL (ref 8.9–10.3)
Chloride: 103 mmol/L (ref 98–111)
Creatinine, Ser: 0.77 mg/dL (ref 0.61–1.24)
GFR, Estimated: >60 mL/min (ref >60)
Glucose, Bld: 99 mg/dL (ref 70–99)
Potassium: 3.2 mmol/L — ABNORMAL LOW (ref 3.5–5.1)
Sodium: 137 mmol/L (ref 135–145)

## 2020-06-29 LAB — CK
Total CK: 1756 U/L — ABNORMAL HIGH (ref 49–397)
Total CK: 2078 U/L — ABNORMAL HIGH (ref 49–397)

## 2020-06-29 MED ORDER — SODIUM CHLORIDE 0.9 % IV BOLUS
500.0000 mL | Freq: Once | INTRAVENOUS | Status: AC
  Administered 2020-06-29: 500 mL via INTRAVENOUS

## 2020-06-29 MED ORDER — POTASSIUM CHLORIDE CRYS ER 20 MEQ PO TBCR
40.0000 meq | EXTENDED_RELEASE_TABLET | Freq: Once | ORAL | Status: AC
  Administered 2020-06-29: 40 meq via ORAL
  Filled 2020-06-29: qty 2

## 2020-06-29 MED ORDER — SODIUM CHLORIDE 0.9 % IV BOLUS
1000.0000 mL | Freq: Once | INTRAVENOUS | Status: AC
  Administered 2020-06-29: 1000 mL via INTRAVENOUS

## 2020-06-29 NOTE — Consult Note (Signed)
[facility] Face-to-Face Psychiatry Consult   Reason for Consult:  Acute psychosis Referring Physician:  Dr Terisa Starr  Patient Identification: Donald Curry MRN:  [id number] Principal Diagnosis: AMS Diagnosis:  Principal Problem:   Cannabis-induced mood disorder (HCC) Active Problems:   Encephalitis   AMS (altered mental status)   Total Time spent with patient: 45 minutes  Subjective:   Donald Curry is a 25 y.o. male patient admitted with aggression, confusion, AMS; psych consult for evaluation of acute psychosis. Patient seen and evaluated in person by this provider, safety sitter at bedside.  He states " I feel much better.  Please tell everyone that I am sorry for my actions yesterday.  I am now able to think more clearly, and understand they were only just trying to help me.  I am no longer experiencing paranoia that I was yesterday."  Patient is able to offer more insight regarding his substance-induced psychosis, and importance of refraining from illicit substances to include THC and CBD based products.  Patient reports continue to be compliant with Haldol.  Patient is no longer in restraints, has been out for about 18 hours.  He does jokingly ask" the w want all these restraints back?  If not I can use them as a way memory to always remember this incident, so that I know not to use substances again.  Patient denies suicidal ideations, homicidal ideations, and or auditory visual hallucinations.   Patient seen and evaluated in person by this provider with mother at his bedside, patient agreeable for her to be part of the assessment.  He states, "I feel better.  I couldn't get it figured out until I talked to myself about it last night."  Paranoid with the RN providing his potassium.  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  Mother reports his sister has a history of depression and anxiety, not bipolar d/o.  Infection cleared by MD.  Client resistant to taking haldol to assist with  the Delta 10 clearing.  Explained what the medication was and how it would help, mother also encouraged him.  Client agreeable and relieved this is not a lifetime medication.  Haldol 2 mg at bedtime changed to 1 mg BID so he can get some benefit now.  Recommend Ativan PRN as ordered and refrain from benadryl administration.   HPI per neurologist:  Donald Curry is a 25 y.o. male with no pertinent medical history who presented to the ED today after he was -brought by his father for concerns of agitation and aggression. His girlfriend noticed last night that he was acting strangely and called Mr. Uemura father for help when he went to take the dog for a walk in the early morning hours and returned home without the dog. Mr. Rathgeber continued acting confused and aggressive for his father so he was brought to the ED for further evaluation. In the ED, he was verbally aggressive with tachycardia, low grade temperature, with tachypnea and an elevated venous lactic acid. EDP was concerned for infectious process as cause of presentation. His UDS was also positive for THC. He required Geodon, Haldol, Benadryl, Ativan, and Versed in the ED for aggression and agitation. He was taken for fluoro-guided LP which was unsuccessful due to patient movements and contaminating the sterile field.    On evaluation patient is alert and oriented, calm and cooperative, very pleasant upon approach.  He is noted to be sitting up in bed engaging well with the sitter.  Patient denies  any access to weapons. He does admit to using delta 10.  He also recalls his psychosis and hallucinations, that he was exhibiting days previous.  He reports moderate sleep and fair appetite at this time.  He is also open to receiving outpatient psychiatric services to include a psychiatrist, substance abuse treatment, and therapeutic services.  Patient denies any auditory and/or visual hallucinations, does not appear to be responding to internal or  external stimuli.  There is no evidence of delusional thought content and patient appears to answer all questions appropriately.  At this time patient appears to be stable to psychiatrically clear, with support system services in place.  Writer did touch base with both parents, to update on improvement in mentation and cessation of psychosis.  Both expressed concerns regarding patient being sent home too early, and his need for ongoing inpatient services.  Explained to parents that patient will be discharged home on current medication to include Haldol, for further management of his substance-induced psychosis.  Recommend outpatient psychological evaluation, once psychosis has resolved to receive a more accurate diagnosis.  Also discussed in the event patient is suspected addiction, will refrain from administering benzodiazepines due to addictive properties for long-term use.  All questions and concerns were answered, support and encouragement were offered.  Past Psychiatric History: none  Risk to Self:  potentially related to his current state Risk to Others:  aggressive at times Prior Inpatient Therapy:  none Prior Outpatient Therapy:  none  Past Medical History:  Past Medical History:  Diagnosis Date  . Obesity    History reviewed. No pertinent surgical history. Family History:  Family History  Problem Relation Age of Onset  . Mood Disorder Sister        per mother, patient's sister with history of ? bipolar, SI, 2X suicide attempts    Family Psychiatric  History: sister with bipolar d/o Social History:  Social History   Substance and Sexual Activity  Alcohol Use Yes     Social History   Substance and Sexual Activity  Drug Use Not on file    Social History   Socioeconomic History  . Marital status: Single    Spouse name: Not on file  . Number of children: Not on file  . Years of education: Not on file  . Highest education level: Not on file  Occupational History  . Not  on file  Tobacco Use  . Smoking status: Not on file  . Smokeless tobacco: Not on file  Substance and Sexual Activity  . Alcohol use: Yes  . Drug use: Not on file  . Sexual activity: Not on file  Other Topics Concern  . 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   Has a Social research officer, government with masters in 3.5 years    Social Determinants of Corporate investment banker Strain: Not on file  Food Insecurity: Not on file  Transportation Needs: Not on file  Physical Activity: Not on file  Stress: Not on file  Social Connections: Not on file   Additional Social History:    Allergies:  No Known Allergies  Labs:  Results for orders placed or performed during the hospital encounter of 06/25/20 (from the past 48 hour(s))  Basic metabolic panel     Status: Abnormal   Collection Time: 06/28/20  6:05  AM  Result Value Ref Range   Sodium 137 135 - 145 mmol/L   Potassium 3.5 3.5 - 5.1 mmol/L   Chloride 107 98 - 111 mmol/L   CO2 23 22 - 32 mmol/L   Glucose, Bld 87 70 - 99 mg/dL    Comment: Glucose reference range applies only to samples taken after fasting for at least 8 hours.   BUN 5 (L) 6 - 20 mg/dL   Creatinine, Ser 1.610.73 0.61 - 1.24 mg/dL   Calcium 9.0 8.9 - 09.610.3 mg/dL   GFR, Estimated >04>60 >54>60 mL/min    Comment: (NOTE) Calculated using the CKD-EPI Creatinine Equation (2021)    Anion gap 7 5 - 15    Comment: Performed at [facility], [address] [place], [place] [address]  CBC     Status: Abnormal   Collection Time: 06/28/20  6:05 AM  Result Value Ref Range   WBC 11.9 (H) 4.0 - 10.5 K/uL   RBC 4.74 4.22 - 5.81 MIL/uL   Hemoglobin 14.2 13.0 - 17.0 g/dL   HCT 91.440.8 78.239.0 - 95.652.0 %   MCV 86.1 80.0 - 100.0 fL   MCH 30.0 26.0 - 34.0 pg   MCHC 34.8 30.0 - 36.0 g/dL   RDW 21.312.0 08.611.5 - 57.815.5 %   Platelets 327 150 - 400 K/uL   nRBC 0.0 0.0 -  0.2 %    Comment: Performed at [facility], [address] [place], [place] [address]  CK     Status: Abnormal   Collection Time: 06/28/20  6:05 AM  Result Value Ref Range   Total CK 1,029 (H) 49 - 397 U/L    Comment: Performed at [facility], [address] [place], [place] [address]  CK     Status: Abnormal   Collection Time: 06/29/20  2:43 AM  Result Value Ref Range   Total CK 1,756 (H) 49 - 397 U/L    Comment: Performed at [facility], [address] [place], [place] [address]    Current Facility-Administered Medications  Medication Dose Route Frequency Provider Last Rate Last Admin  . 0.9 %  sodium chloride infusion   Intravenous Continuous Shirlean MylarMahoney, Caitlin, MD 200 mL/hr at 06/29/20 [id number] Rate Change at 06/29/20 0939  . acetaminophen (TYLENOL) tablet 650 mg  650 mg Oral Q6H PRN Cora CollumPaige, Victoria J, DO       Or  . acetaminophen (TYLENOL) suppository 650 mg  650 mg Rectal Q6H PRN Idalia NeedlePaige, Victoria J, DO      . enoxaparin (LOVENOX) injection 60 mg  60 mg Subcutaneous Q24H Terisa StarrBrown, Carina M, MD   60 mg at 06/28/20 1833  . haloperidol (HALDOL) tablet 1 mg  1 mg Oral BID Dagar, Geralynn RileAnjali, MD   1 mg at 06/28/20 2356   Or  . haloperidol lactate (HALDOL) injection 1 mg  1 mg Intramuscular BID Dagar, Geralynn RileAnjali, MD   1 mg at 06/29/20 [id number]  . LORazepam (ATIVAN) injection 2 mg  2 mg Intravenous PRN Lilland, Alana, DO   2 mg at 06/28/20 1932  . polyethylene glycol (MIRALAX / GLYCOLAX) packet 17 g  17 g Oral Daily Sabino DickEspinoza, Alejandra, DO        Musculoskeletal: Strength & Muscle Tone: within normal limits Gait & Station: did not witness Patient leans: N/A            Psychiatric Specialty Exam:  Presentation  General Appearance: Appropriate for Environment; Casual  Eye Contact:Fair  Speech:Clear and Coherent; Normal Rate  Speech Volume:Normal  Handedness:Right   Mood and Affect  Mood:Euthymic  Affect:Appropriate; Congruent   Thought Process  Thought  Processes:Coherent; Goal Directed  Descriptions of Associations:Intact  Orientation:Full (Time, Place and Person)  Thought Content:Logical; WDL  History of Schizophrenia/Schizoaffective disorder:No  Duration of Psychotic Symptoms:Less than six months  Hallucinations:Hallucinations: None  Ideas of Reference:None  Suicidal Thoughts:Suicidal Thoughts: No  Homicidal Thoughts:Homicidal Thoughts: No   Sensorium  Memory:Immediate Fair; Recent Fair; Remote Fair  Judgment:Fair  Insight:Fair   Executive Functions  Concentration:Fair  Attention Span:Fair  Recall:Fair  Fund of Knowledge:Fair  Language:Fair   Psychomotor Activity  Psychomotor Activity:Psychomotor Activity: Normal   Assets  Assets:Physical Health; Leisure Time; Housing; Health and safety inspector; Desire for Improvement; Social Support; Talents/Skills   Sleep  Sleep:Sleep: Fair   Physical Exam: Physical Exam Vitals and nursing note reviewed.  Constitutional:      Appearance: Normal appearance.  HENT:     Head: Normocephalic.     Nose: Nose normal.  Pulmonary:     Effort: Pulmonary effort is normal.  Musculoskeletal:        General: Normal range of motion.  Neurological:     General: No focal deficit present.     Mental Status: He is alert and oriented to person, place, and time.  Psychiatric:        Attention and Perception: Attention normal.        Mood and Affect: Mood is anxious. Affect is blunt.        Speech: Speech normal.        Behavior: Behavior is cooperative.        Thought Content: Thought content is delusional.        Cognition and Memory: Cognition is impaired. Memory is impaired.        Judgment: Judgment normal.    Review of Systems  Psychiatric/Behavioral: Positive for substance abuse. The patient is nervous/anxious.   All other systems reviewed and are negative.  Blood pressure 133/80, pulse 95, temperature 97.9 F (36.6 C), temperature source Oral, resp. rate  20, height 6\' 1"  (1.854 m), weight 135.1 kg, SpO2 99 %. Body mass index is 39.29 kg/m.  Treatment Plan Summary: Cannabis induced mood disorder: -Continue Haldol 1mg  po BID, may discharge on this medication. -Recommend TLC, schedule appointment for intensive outpatient substance abuse therapy, and outpatient psych medication management.  Patient may be a good candidate for call behavioral health intensive outpatient, they also offers chemical dependency intensive outpatient.  Agitation: Appears to be resolved at this time. Will continue prn medications as needed.  -Patient continues to be without restraints.  At this time we will continue with current as needed medications.  If patient's mentation continues to improve, we can consider discontinuing safety sitter and IVC.  Disposition: No evidence of imminent risk to self or others at present.   Patient does not meet criteria for psychiatric inpatient admission. Supportive therapy provided about ongoing stressors. Refer to IOP. Discussed crisis plan, support from social network, calling 911, coming to the Emergency Department, and calling Suicide Hotline.  , FNP 06/29/2020 2:54 PM

## 2020-06-29 NOTE — Progress Notes (Addendum)
FMTS Attending Daily Note: Terisa Starr, MD  Team Pager [phone] Pager [phone]  I have seen and examined this patient, reviewed their chart. I have discussed this patient with the resident. I agree with the resident's findings, assessment and care plan. Many edits within this note below.    Family Medicine Teaching Service Daily Progress Note Intern Pager: [phone]  Patient name: Donald Curry Medical record number: [id number] Date of birth: 07-26-1995 Age: 25 y.o. Gender: male  Primary Care Provider: Patient, No Pcp Per (Inactive) Consultants:  Psych, Neuro (s/o) Code Status: Full  Pt Overview and Major Events to Date:  3/25 - Admitted, IVC placed  Assessment and Plan: Gibran Veselka a 24 y.o.malepresenting with altered mental status due to substance vs. New onset mood disorder. Marland Kitchen PMH is significant for ADHD.  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  -Psychiatry consulted and signed off, recommended discontinuation of Haldol given CK. Recommended rescinding IVC and sitter. Given patient's clinical state, family requesting second opinion. Personally discussed with Dr. Raelene Bott will see in AM 3/31.  -Follow-up blood drug screen -Follow-up urine and blood cultures - Tylenol 650 q 6prn  - Haldol 5mg  prn for agitation - Ativan 2mg  prn agitation  - F/u psych   Elevated CK CK 2053. Discussed with Psychiatry--likely due to Haloperidol. As clinically well, creatinine normal, and voiding will reduce fluids and monitor X 1 more day.   Hypokalemia Potassium 3.3 this morning. Repleted with Kcl  -am BMP -Replete as appropriate  Leukocytosis  WBC 11.1 likely in the setting of a stress reaction due to his psychosis as well as THC use - Continue to monitor   [facility] Use  UDS admission positive for THC.  Patient mentioned taking delta 10 THC to neurology during admission. -  encourage cessation  ADHD Per chart review. No medications on chart.  FEN/GI: Regular diet Prophylaxis:lovenox   Status is: Inpatient  Remains inpatient appropriate because:Ongoing diagnostic testing needed not appropriate for outpatient work up and Unsafe d/c plan  Dispo:             Patient From: Home             Planned Disposition: Home             Medically stable for discharge: No   Subjective:  No acute events overnight. Patient in the restroom when we came into the room. Will ensure a member of the team checks on him this afternoon as well.   Patient seen this AM and PM. Patient reports he is sorry for actions and that he only loves , ongoing speech for >5 minutes.   Objective: Temp:  [97.9 F (36.6 C)-99 F (37.2 C)] 97.9 F (36.6 C) (03/29 0800) Pulse Rate:  [78-106] 95 (03/29 0800) Resp:  [16-20] 20 (03/29 0800) BP: (131-156)/(72-93) 133/80 (03/29 0800) SpO2:  [96 %-100 %] 99 % (03/29 0800) Physical Exam: Cardiac: Warm well perfused.  Capillary refill less than 3 seconds Respiratory breathing comfortably on room air Psych:  Rapid pressured speech    Laboratory: Recent Labs  Lab 06/26/20 0207 06/27/20 0141 06/28/20 0605  WBC 10.6* 11.1* 11.9*  HGB 14.3 14.2 14.2  HCT 39.5 41.5 40.8  PLT 283 325 327   Recent Labs  Lab 06/25/20 0651 06/25/20 0955 06/26/20 0207 06/26/20 1441 06/27/20 0141 06/28/20 0605 06/29/20 1519  NA 136   < > 136   < > 136 137  137  K 3.3*   < > 3.2*   < > 3.4* 3.5 3.2*  CL 102   < > 105   < > 106 107 103  CO2 17*   < > 20*   < > 19* 23 23  BUN 10   < > 7   < > 6 5* 5*  CREATININE 0.99   < > 0.78   < > 0.66 0.73 0.77  CALCIUM 9.5   < > 8.6*   < > 8.9 9.0 8.9  PROT 8.6*  --  7.0  --   --   --   --   BILITOT 1.4*  --  1.3*  1.4*  --   --   --   --   ALKPHOS 81  --  64  --   --   --   --   ALT 25  --  27  --   --   --   --   AST 32  --  39  --   --   --   --   GLUCOSE 88   < > 96   < > 91 87 99    < > = values in this interval not displayed.    Imaging/Diagnostic Tests: None new   Cora Collum, DO 06/29/2020, 5:14 PM PGY-1, [facility] FPTS Intern pager: [phone], text pages welcome

## 2020-06-29 NOTE — Progress Notes (Signed)
RN spoke with pt's mother extensively re: restraint d/c last night and IVC paperwork in place. Advised IVC paperwork will expire 4/1. Educated on current plan of care, overnight behaviors, security measures in place, and medication administrations. Mom also shared maternal and paternal history of mental illness and substance abuse history. RN assured pt's mother that history has been documented by providers. All questions answered to satisfaction. Mom would prefer to remain main point of contact for family.

## 2020-06-29 NOTE — Progress Notes (Signed)
Pt's girlfriend called for updated, was only told that pt was stable but no more information could be given since pt's mother denied permission. The conversation was relayed to the assistant director so she can follow up with patient.

## 2020-06-30 DIAGNOSIS — F12988 Cannabis use, unspecified with other cannabis-induced disorder: Secondary | ICD-10-CM | POA: Diagnosis not present

## 2020-06-30 DIAGNOSIS — F39 Unspecified mood [affective] disorder: Secondary | ICD-10-CM | POA: Diagnosis not present

## 2020-06-30 LAB — CBC
HCT: 42.9 % (ref 39.0–52.0)
Hemoglobin: 14.5 g/dL (ref 13.0–17.0)
MCH: 29.2 pg (ref 26.0–34.0)
MCHC: 33.8 g/dL (ref 30.0–36.0)
MCV: 86.5 fL (ref 80.0–100.0)
Platelets: 364 10*3/uL (ref 150–400)
RBC: 4.96 MIL/uL (ref 4.22–5.81)
RDW: 12.1 % (ref 11.5–15.5)
WBC: 11.1 10*3/uL — ABNORMAL HIGH (ref 4.0–10.5)
nRBC: 0 % (ref 0.0–0.2)

## 2020-06-30 LAB — BASIC METABOLIC PANEL
Anion gap: 8 (ref 5–15)
BUN: <5 mg/dL — ABNORMAL LOW (ref 6–20)
CO2: 23 mmol/L (ref 22–32)
Calcium: 9 mg/dL (ref 8.9–10.3)
Chloride: 105 mmol/L (ref 98–111)
Creatinine, Ser: 0.72 mg/dL (ref 0.61–1.24)
GFR, Estimated: >60 mL/min (ref >60)
Glucose, Bld: 101 mg/dL — ABNORMAL HIGH (ref 70–99)
Potassium: 3.3 mmol/L — ABNORMAL LOW (ref 3.5–5.1)
Sodium: 136 mmol/L (ref 135–145)

## 2020-06-30 LAB — CK: Total CK: 2053 U/L — ABNORMAL HIGH (ref 49–397)

## 2020-06-30 LAB — CULTURE, BLOOD (ROUTINE X 2)
Culture: NO GROWTH
Special Requests: ADEQUATE

## 2020-06-30 MED ORDER — POTASSIUM CHLORIDE CRYS ER 20 MEQ PO TBCR
40.0000 meq | EXTENDED_RELEASE_TABLET | Freq: Once | ORAL | Status: AC
  Administered 2020-06-30: 40 meq via ORAL
  Filled 2020-06-30: qty 2

## 2020-06-30 NOTE — Progress Notes (Addendum)
Spoke with pt regarding pt regarding discharge today. Explained that psychiatry feel he is ready for discharge. Pt would like to go home. He also apologized for calling me a "false prophet". He also would like me to apologize to everyone in the ER for his behaviour. He would like to me to tell his parents how much he loves Casey-his GF. I said will let his dad know. I said I will speak with his parents regarding their views on discharge today.  Spoke with Karolee Ohs, pt's father. He is still concerned about his behavior and that he is "worse". He is afraid that he wont be accepting of outpatient treatment. He said if he came home he might not do well because of the state that he is in. He said he has discussed with his RN and she feels he is not in a good state to be discharged right now. Karolee Ohs would like a second opinion from another psychiatrist. I explained that we are waiting for a response back from Dr Lucianne Muss regarding a second opinion. He requested [place] to keep him up to date on things. I explained he will not be discharged today and he will still remain under the IVC. Also told him that from my conversation with Weston Brass this afternoon, he does not have capacity. I also said they can come visit him today. I am more than happy to provide further updates at bedside when family come in later today.  Towanda Octave MD, PGY-2 [facility]

## 2020-06-30 NOTE — Progress Notes (Signed)
FMTS Brief Note: Regarding IVC, patient was placed under IVC by Emergency Department. Psychiatry FNP has recommended discontinuing IVC as of today 3/30. Given patient's current status and state, family requested second opinion. Spoke with Psychiatry FNP and Dr. Lucianne Muss. Psychiatry will see patient again tomorrow to provide a second opinion about this and capacity. Psychiatry to see in AM 3/31. Will remain under IVC until that time. Terisa Starr, MD  Family Medicine Teaching Service

## 2020-06-30 NOTE — Progress Notes (Addendum)
Called to speak to pt's dad-Amir at bedside. Provided him with an update of today adding to our phone conversation earlier. I explained he will have a second psychiatry opinion tomorrow by Dr Lucianne Muss. He also requests that Fredna Dow no longer sees Weston Brass for consults. Karolee Ohs was appreciative of my time explaining the plan to him this evening and the care Weston Brass has received by our team.  Towanda Octave MD PGY-2 [facility] Family Medicine

## 2020-06-30 NOTE — Consult Note (Signed)
  Contacted by medical team to review medications as patient CK levels were continuing to rise. There was a possibility that Haldol was contributing to the increase of CK levels. Patient was seen and reassessed. He is alert and oriented, calm and cooperative. He has clear, coherent and goal directed thought processes. 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 During the reassessment this nurse practitioner completed a capacity evaluation in which it was determined that he does have full mental capacity to make his own medical decision and participate in his care. He is very remorseful and apologetic for his behaviors and psychosis that lead to this admission. He also understands the importance of refraining from all illicit substances.   I explored effects of substances on his mental health with him. Patient is in agree ance about the negative effects. Risks especially on effects of substances with respect to psychosis and schizophrenia was discussed with him.Effects of substances seems to have worn off. His mood has dramatically improved. Patient is no longer expressing any  Psychosis, he is no longer exhibiting delusions or responding to internal stimuli. He is engaging more. Patient has improved insight on the role of substances in his mental health. He does seem motivated to engage in any relapse preventive measure at this time.   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 Pt denies depression, anxiety, irritability, hallucinations, and is linear, coherent, with improved affect this morning compared to day prior. Pt states he is amenable to receive OP substance use disorder treatment. Pt admits that his substance use has been controlling his life and states he will follow up after discharge. Currently, patient lives with his GF and plans to go  back to her after discharge.  Patient endorses good sleep and good appetite. Denies SI/HI/AVH. Denies side effects from medications.  No further concerns or questions for the treatment team at this time. Discussed these findings with parents yesterday and they disagreed with discharge and  Psych clearing patient.   -Recommend substance abuse resources outpatient.  -Patient may benefit from CD-IOP at [facility].  -DC Recruitment consultant and rescind IVC.  -DC Haldol at this time.

## 2020-06-30 NOTE — Plan of Care (Signed)
  Problem: Education: Goal: Knowledge of General Education information will improve Description: Including pain rating scale, medication(s)/side effects and non-pharmacologic comfort measures Outcome: Progressing   Problem: Health Behavior/Discharge Planning: Goal: Ability to manage health-related needs will improve Outcome: Progressing   Problem: Clinical Measurements: Goal: Ability to maintain clinical measurements within normal limits will improve Outcome: Progressing Goal: Diagnostic test results will improve Outcome: Progressing Goal: Respiratory complications will improve Outcome: Progressing   Problem: Activity: Goal: Risk for activity intolerance will decrease Outcome: Progressing   Problem: Coping: Goal: Level of anxiety will decrease Outcome: Progressing   Problem: Elimination: Goal: Will not experience complications related to bowel motility Outcome: Progressing   Problem: Pain Managment: Goal: General experience of comfort will improve Outcome: Progressing   Problem: Safety: Goal: Ability to remain free from injury will improve Outcome: Progressing   Problem: Skin Integrity: Goal: Risk for impaired skin integrity will decrease Outcome: Progressing   Problem: Safety: Goal: Violent Restraint(s) Outcome: Progressing   

## 2020-07-01 LAB — CK
Total CK: 2223 U/L — ABNORMAL HIGH (ref 49–397)
Total CK: 2991 U/L — ABNORMAL HIGH (ref 49–397)

## 2020-07-01 MED ORDER — SODIUM CHLORIDE 0.9 % IV SOLN: INTRAVENOUS | Status: DC

## 2020-07-01 MED ORDER — DIVALPROEX SODIUM 250 MG PO DR TAB
500.0000 mg | DELAYED_RELEASE_TABLET | Freq: Two times a day (BID) | ORAL | Status: DC
  Administered 2020-07-01 – 2020-07-03 (×4): 500 mg via ORAL
  Filled 2020-07-01 (×4): qty 2

## 2020-07-01 NOTE — Progress Notes (Signed)
Family Medicine Teaching Service Daily Progress Note Intern Pager: [phone]  Patient name: Donald Curry Medical record number: [id number] Date of birth: [birth year] Age: 25 y.o. Gender: male  Primary Care Provider: Patient, No Pcp Per (Inactive) Consultants: Psych (s/o) Code Status: Full   Pt Overview and Major Events to Date:  3/25 - Admitted, IVC placed  Assessment and Plan: Donald Curry a 24 y.o.malepresenting with altered mental status due to substance vs. New onset mood disorder. PMH is significant for ADHD.  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  -Psychiatry consulted and signed off, recommended discontinuation of Haldol given CK. Recommended rescinding IVC and sitter. Given patient's clinical state, family requesting second opinion. Attending discussed with Dr. Lucianne Muss- planned to see patient this am, but per family psych has not come by yet.  - Tylenol 650 q 6prn  - Ativan 2mg  prn agitation - d/c fluids   Elevated CK CK 2223. Discussed with Psychiatry--likely due to Haloperidol. D/c Haldol yesterday and CK still rose. As clinically well, creatinine normal, and voiding will reduce fluids and monitor X 1 more day.  - mIVF 122ml/h  Hypokalemia Potassium 3.3 yesterday and was repleted with 80m Kcl. Did not check today, will check tomorrow  -am BMP  -consider sending home on potassium   THC Use  UDS admission positive for THC.  Patient mentioned taking delta 10 THC to neurology during admission. - encourage cessation  ADHD Per chart review. No medications on chart.  FEN/GI: Regular diet Prophylaxis:lovenox   Status is: Inpatient  Remains inpatient appropriate because:Ongoing diagnostic testing needed not appropriate for outpatient work up and Unsafe d/c plan  Dispo:             Patient From: Home             Planned Disposition: Home              Medically stable for discharge: No  Subjective:  No acute events overnight. Patient laying in bed stating he was tired and just needed to use the restroom. Denied any complaints   Objective: Temp:  [98 F (36.7 C)-98.6 F (37 C)] 98.2 F (36.8 C) (03/31 0500) Pulse Rate:  [77-115] 77 (03/31 0500) Resp:  [18] 18 (03/31 0500) BP: (147-155)/(83-96) 151/96 (03/31 0500) SpO2:  [98 %-100 %] 98 % (03/31 0500) Physical Exam: General: laying in bed, NAD Cardiovascular: RRR no murmurs Respiratory: CTAB. Normal WOB Abdomen: soft, non distended  Extremities: warm, dry. Moving spontaneously  Laboratory: Recent Labs  Lab 06/27/20 0141 06/28/20 0605 06/30/20 0408  WBC 11.1* 11.9* 11.1*  HGB 14.2 14.2 14.5  HCT 41.5 40.8 42.9  PLT 325 327 364   Recent Labs  Lab 06/25/20 0651 06/25/20 0955 06/26/20 0207 06/26/20 1441 06/28/20 0605 06/29/20 1519 06/30/20 0408  NA 136   < > 136   < > 137 137 136  K 3.3*   < > 3.2*   < > 3.5 3.2* 3.3*  CL 102   < > 105   < > 107 103 105  CO2 17*   < > 20*   < > 23 23 23   BUN 10   < > 7   < > 5* 5* <5*  CREATININE 0.99   < > 0.78   < > 0.73 0.77 0.72  CALCIUM 9.5   < > 8.6*   < > 9.0 8.9 9.0  PROT 8.6*  --  7.0  --   --   --   --  BILITOT 1.4*  --  1.3*  1.4*  --   --   --   --   ALKPHOS 81  --  64  --   --   --   --   ALT 25  --  27  --   --   --   --   AST 32  --  39  --   --   --   --   GLUCOSE 88   < > 96   < > 87 99 101*   < > = values in this interval not displayed.    Imaging/Diagnostic Tests: None new  Cora Collum, DO 07/01/2020, 7:17 AM PGY-1, [facility] Family Medicine FPTS Intern pager: [phone], text pages welcome

## 2020-07-01 NOTE — Progress Notes (Signed)
Provided update to mom-Sherry at bedside. She explained that psychiatry has not been around to see him this morning. I will update them later today when I have more information.  Towanda Octave MD PGY-2, [facility] Family Medicine

## 2020-07-01 NOTE — Progress Notes (Addendum)
Discussed plan of care with patient's parents. Dr. Bronwen Betters of psychiatry evaluated patient and found him not to meet inpatient criteria from a behavioral health standpoint. He had capacity today, and as such no longer qualifies for IVC which ends 4/1. Patient is still appropriate for inpatient medical treatment as he still has greatly elevated CK, which increased after a trial off of IVF. Most recent CK is ~3K. Will restart IVF, and obtain UA+micro and trend CK and kidney function. So far, kidney function has been normal and stable. Patient has been off Haldol for >24 hours, so unlikely for this to be the source of elevated CK. Suspect it could be from ingestion of THC as this is an active substance for quite a long time. Consider nephrology recommendations tomorrow if CK remains elevated. Patient amenable to remaining inpatient voluntarily tonight and starting the recommended depakote.  - NS 150 mL/hr - F/u UA+ micro -F/u AM BMP, CK - Depakote 500 mg BID - No longer under IVC  Shirlean Mylar, MD [facility] Family Medicine Residency, PGY-2

## 2020-07-01 NOTE — Consult Note (Signed)
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 Upon further work up, appeared to have been experiencing SIPD; UDS+THC.  Patient was previously assessed by psychiatry who had recommended scheduled haldol for psychosis but CK levels began to rise and there as concern that this was contributing and haldol was discontinued. Patient was seen by NP Starkes for capacity assessment and it was deemed that he had capacity for medical decision making and wanted to return home to his girlfriend and seek substance use treatment. Patient's family requested second opinion.  Patient interviewed by me today. He is calm, coopertive and pleasant. He is tangential and grandiose at times but does display a fair level of insight and recognizes that substance use played a role in his presentation to the hospital. 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 He states that he also some youtube channels as well where he talks about this and related topics. He denies SI/HI/AVH. He denies paranoia/IOR. No delusional TC elicited orvolunteered.  Patient is tangential at times and displays rapid speech but is easily  redirectable. Pt denies other substance use apart from "delta 10" which he plans on discontinuing use.  Pt does objectively appear hypomanic but does not appear to be gravely disabled by these symptoms to the point of needing to be involuntarily committed to a psychiatric hospital  Spoke to mother outside of room. She states that Donald Curry is very intelligent and graduated school early, he has a degree in Careers information officer and has discussed doing things with "recursion" for years and that this has always been an interest of his. She found out about his business in July for the first time and states that he has applied for money for start up. She raises concern for drug use and mentions that when they checked his apartment it looked like "breaking bad" and that there was some some concern that there could be cocaine use which pt specifically denied during interview. Mother raises concern about the possibility that he may not be amenable for substance use treatment and/or continue to use which could have serious consequences including death. She states that she feels that he needs to be in a program "for a couple weeks". Discussed with mother that even though pt met IVC criteria a week ago,that his state has significantly improved to the point where he does meet IVC criteria (imminent risk to self or others); also informed her that if a psychiatric hospitalization were to occur, is only 5-7 days. Mother agrees that he has significantly improved and indicates that he is 75% his normal self and says he is not normally as tangential as he is today. She states that a week ago he did make comments that were suicidal in  nature--discussed with her that even though that Is the case, he  currently denies SI and has done so for several days and is no longer an imminent danger to self and therefore no longer meets criteria; also discussed that people cannot be IVC'd or remain under IVC for "what might happen". She states that  he would likely benefit more from acquiring life skills and not using substances. Mother states that since there is a family  History of mental illness  And that she is concerned for a possible mood or psychotic disorder. Discussed with mother that it is difficult to determine if patient's presentation is related solely to drug use, primary mental illness, or a combination of both but that early 98s are when psychotic/mood disorders tend to manifest and that drug use can sometimes cause a first manic or psychotic episode.    Past Psychiatric History: none  Risk to Self:  no Risk to Others:  no Prior Inpatient Therapy:  no Prior Outpatient Therapy:  mother states he was previously in therapy and was getting ADHD medications.  Past Medical History:  Past Medical History:  Diagnosis Date  . Obesity    History reviewed. No pertinent surgical history. Family History:  Family History  Problem Relation Age of Onset  . Mood Disorder Sister        per mother, patient's sister with history of ? bipolar, SI, 2X suicide attempts    Family Psychiatric  History: bipolar , anxiety, depression Social History:  Social History   Substance and Sexual Activity  Alcohol Use Yes     Social History   Substance and Sexual Activity  Drug Use Not on file    Social History   Socioeconomic History  . Marital status: Single    Spouse name: Not on file  . Number of children: Not on file  . Years of education: Not on file  . Highest education level: Not on file  Occupational History  . Not on file  Tobacco Use  . Smoking status: Not on file  . Smokeless tobacco: Not on file  Substance and Sexual Activity  . Alcohol use: Yes  . Drug use: Not on file  . Sexual activity: Not on file  Other Topics Concern  . 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   Has a Microbiologist with masters in 3.5 years    Social Determinants of Radio broadcast assistant Strain: Not on file  Food Insecurity: Not on file  Transportation Needs: Not on file  Physical Activity: Not on file  Stress: Not on file  Social Connections: Not on file   Additional Social History:    Allergies:  No Known Allergies  Labs:  Results for orders placed or performed during the hospital encounter of 06/25/20 (from the past 48 hour(s))  Basic metabolic panel     Status: Abnormal   Collection Time: 06/29/20  3:19 PM  Result Value Ref Range   Sodium 137 135 - 145 mmol/L   Potassium 3.2 (L) 3.5 - 5.1 mmol/L   Chloride 103 98 - 111 mmol/L   CO2 23 22 - 32 mmol/L   Glucose, Bld 99 70 - 99 mg/dL    Comment: Glucose reference range applies only to samples taken after fasting for at least 8 hours.  BUN 5 (L) 6 - 20 mg/dL   Creatinine, Ser 0.77 0.61 - 1.24 mg/dL   Calcium 8.9 8.9 - 10.3 mg/dL   GFR, Estimated >60 >60 mL/min    Comment: (NOTE) Calculated using the CKD-EPI Creatinine Equation (2021)    Anion gap 11 5 - 15    Comment: Performed at [facility] [address], [place], [place] [address]  CK     Status: Abnormal   Collection Time: 06/29/20  3:19 PM  Result Value Ref Range   Total CK 2,078 (H) 49 - 397 U/L    Comment: Performed at [facility], [place] [address], [place], [place] [address]  Basic metabolic panel     Status: Abnormal   Collection Time: 06/30/20  4:08 AM  Result Value Ref Range   Sodium 136 135 - 145 mmol/L   Potassium 3.3 (L) 3.5 - 5.1 mmol/L   Chloride 105 98 - 111 mmol/L   CO2 23 22 - 32 mmol/L   Glucose, Bld 101 (H) 70 - 99 mg/dL    Comment: Glucose reference range applies only to samples taken after fasting for at least 8 hours.   BUN <5 (L) 6 - 20 mg/dL   Creatinine, Ser 0.72 0.61 - 1.24 mg/dL   Calcium 9.0 8.9 - 10.3 mg/dL   GFR, Estimated >60 >60 mL/min    Comment: (NOTE) Calculated using the CKD-EPI Creatinine Equation  (2021)    Anion gap 8 5 - 15    Comment: Performed at [facility] [address] [place], [place] [address]  CBC     Status: Abnormal   Collection Time: 06/30/20  4:08 AM  Result Value Ref Range   WBC 11.1 (H) 4.0 - 10.5 K/uL   RBC 4.96 4.22 - 5.81 MIL/uL   Hemoglobin 14.5 13.0 - 17.0 g/dL   HCT 42.9 39.0 - 52.0 %   MCV 86.5 80.0 - 100.0 fL   MCH 29.2 26.0 - 34.0 pg   MCHC 33.8 30.0 - 36.0 g/dL   RDW 12.1 11.5 - 15.5 %   Platelets 364 150 - 400 K/uL   nRBC 0.0 0.0 - 0.2 %    Comment: Performed at [facility], [place] [address] [place], [place] [address]  CK     Status: Abnormal   Collection Time: 06/30/20  4:08 AM  Result Value Ref Range   Total CK 2,053 (H) 49 - 397 U/L    Comment: Performed at [facility], [place] [address] [place], [place] [address]  CK     Status: Abnormal   Collection Time: 07/01/20  3:35 AM  Result Value Ref Range   Total CK 2,223 (H) 49 - 397 U/L    Comment: Performed at [facility], [place] [address] [place], [place] [address]    Current Facility-Administered Medications  Medication Dose Route Frequency Provider Last Rate Last Admin  . acetaminophen (TYLENOL) tablet 650 mg  650 mg Oral Q6H PRN Shary Key, DO       Or  . acetaminophen (TYLENOL) suppository 650 mg  650 mg Rectal Q6H PRN Arby Barrette, Victoria J, DO      . enoxaparin (LOVENOX) injection 60 mg  60 mg Subcutaneous Q24H Martyn Malay, MD   60 mg at 06/30/20 1635  . LORazepam (ATIVAN) injection 2 mg  2 mg Intravenous PRN Lilland, Alana, DO   2 mg at 06/28/20 1932  . polyethylene glycol (MIRALAX /  GLYCOLAX) packet 17 g  17 g Oral Daily Sharion Settler, DO        Musculoskeletal: Strength & Muscle Tone: within normal limits Gait & Station: normal Patient leans: N/A            Psychiatric Specialty Exam:  Presentation  General Appearance: Appropriate for Environment; Casual  Eye Contact:Good  Speech:Clear and Coherent; Other (comment)  (rapid)  Speech Volume:Normal  Handedness:Right   Mood and Affect  Mood:Euthymic  Affect:Appropriate; Congruent   Thought Process  Thought Processes:Coherent; Goal Directed (tangential at times but easily redirectable)  Descriptions of Associations:Intact  Orientation:Full (Time, Place and Person)  Thought Content:Tangential (eaily redirectable)  History of Schizophrenia/Schizoaffective disorder:No  Duration of Psychotic Symptoms:Less than six months  Hallucinations:Hallucinations: None  Ideas of Reference:None  Suicidal Thoughts:Suicidal Thoughts: No  Homicidal Thoughts:Homicidal Thoughts: No   Sensorium  Memory:Immediate Good; Recent Fair; Remote Fair  Judgment:Fair  Insight:Fair   Executive Functions  Concentration:Fair  Attention Span:Fair  [place]   Psychomotor Activity  Psychomotor Activity:Psychomotor Activity: Normal   Assets  Assets:Communication Skills; Desire for Improvement; Housing; Social Support; Resilience; Physical Health   Sleep  Sleep:Sleep: Fair   Physical Exam: Physical Exam Constitutional:      Appearance: Normal appearance. He is normal weight.  HENT:     Head: Normocephalic and atraumatic.  Pulmonary:     Effort: Pulmonary effort is normal.  Neurological:     Mental Status: He is alert.    Review of Systems  Psychiatric/Behavioral: Positive for substance abuse. Negative for depression, hallucinations and suicidal ideas.   Blood pressure 129/79, pulse 94, temperature 98.2 F (36.8 C), temperature source Oral, resp. rate 19, height _0  (1.854 m), weight 135.1 kg, SpO2 99 %. Body mass index is 39.29 kg/m.  Treatment Plan Summary:    SIMD/SIPD R/o bipolar spectrum disorder  -patient does not currently meet IVC criteria -recommend 500 mg depkote BID for mood; patient does appear somewhat manic on interview with tangential, rapid speech but is easily  redirectable-LFTs wnl -Recommend substance abuse resources outpatient.  -Patient may benefit from CD-IOP at [facility].  -DC Air cabin crew and rescind IVC.   -Recommendations communicated via epic secure chat to Dr. Thompson Grayer, Dr. Chauncey Reading, Dr. Arby Barrette  Disposition: No evidence of imminent risk to self or others at present.   Patient does not meet criteria for psychiatric inpatient admission.  Ival Bible, MD 07/01/2020 2:58 PM

## 2020-07-01 NOTE — Progress Notes (Signed)
CSW provided patient with resources for intensive outpatient programs. Patient appreciative of information and said he would reach out to the agencies listed to schedule.   Blenda Nicely, [place] Clinical Social Worker [phone]

## 2020-07-02 ENCOUNTER — Other Ambulatory Visit: Payer: Self-pay | Admitting: Family Medicine

## 2020-07-02 DIAGNOSIS — F39 Unspecified mood [affective] disorder: Secondary | ICD-10-CM | POA: Diagnosis not present

## 2020-07-02 DIAGNOSIS — F12988 Cannabis use, unspecified with other cannabis-induced disorder: Secondary | ICD-10-CM | POA: Diagnosis not present

## 2020-07-02 LAB — COMPREHENSIVE METABOLIC PANEL
ALT: 44 U/L (ref 0–44)
AST: 53 U/L — ABNORMAL HIGH (ref 15–41)
Albumin: 3.6 g/dL (ref 3.5–5.0)
Alkaline Phosphatase: 63 U/L (ref 38–126)
Anion gap: 9 (ref 5–15)
BUN: 6 mg/dL (ref 6–20)
CO2: 25 mmol/L (ref 22–32)
Calcium: 8.9 mg/dL (ref 8.9–10.3)
Chloride: 105 mmol/L (ref 98–111)
Creatinine, Ser: 0.78 mg/dL (ref 0.61–1.24)
GFR, Estimated: >60 mL/min (ref >60)
Glucose, Bld: 87 mg/dL (ref 70–99)
Potassium: 3.3 mmol/L — ABNORMAL LOW (ref 3.5–5.1)
Sodium: 139 mmol/L (ref 135–145)
Total Bilirubin: 0.9 mg/dL (ref 0.3–1.2)
Total Protein: 6.5 g/dL (ref 6.5–8.1)

## 2020-07-02 LAB — CK
Total CK: 2451 U/L — ABNORMAL HIGH (ref 49–397)
Total CK: 2716 U/L — ABNORMAL HIGH (ref 49–397)

## 2020-07-02 MED ORDER — POTASSIUM CHLORIDE CRYS ER 20 MEQ PO TBCR
40.0000 meq | EXTENDED_RELEASE_TABLET | Freq: Once | ORAL | Status: AC
  Administered 2020-07-02: 40 meq via ORAL
  Filled 2020-07-02: qty 2

## 2020-07-02 MED ORDER — DIVALPROEX SODIUM 500 MG PO DR TAB: 500.0000 mg | DELAYED_RELEASE_TABLET | Freq: Two times a day (BID) | ORAL | 0 refills | Status: AC

## 2020-07-02 MED FILL — DIVALPROEX SOD DR 500 MG TA: 500 | 30 days supply | Qty: 60 | Fill #0

## 2020-07-02 NOTE — Plan of Care (Signed)
  Problem: Education: Goal: Knowledge of General Education information will improve Description: Including pain rating scale, medication(s)/side effects and non-pharmacologic comfort measures Outcome: Progressing   Problem: Health Behavior/Discharge Planning: Goal: Ability to manage health-related needs will improve Outcome: Progressing   Problem: Clinical Measurements: Goal: Ability to maintain clinical measurements within normal limits will improve Outcome: Progressing Goal: Diagnostic test results will improve Outcome: Progressing Goal: Respiratory complications will improve Outcome: Progressing   Problem: Activity: Goal: Risk for activity intolerance will decrease Outcome: Progressing   Problem: Coping: Goal: Level of anxiety will decrease Outcome: Progressing   Problem: Elimination: Goal: Will not experience complications related to bowel motility Outcome: Progressing   Problem: Pain Managment: Goal: General experience of comfort will improve Outcome: Progressing   Problem: Safety: Goal: Ability to remain free from injury will improve Outcome: Progressing   Problem: Skin Integrity: Goal: Risk for impaired skin integrity will decrease Outcome: Progressing   Problem: Safety: Goal: Violent Restraint(s) Outcome: Progressing

## 2020-07-02 NOTE — Progress Notes (Signed)
Patient's IVC expired at 11:45 am today.  Blenda Nicely, [place] Clinical Social Worker [phone]

## 2020-07-02 NOTE — Progress Notes (Addendum)
Family Medicine Teaching Service Daily Progress Note Intern Pager: [phone]  Patient name: Donald Curry Medical record number: [id number] Date of birth: 1995-06-20 Age: 25 y.o. Gender: male  Primary Care Provider: Patient, No Pcp Per (Inactive) Consultants: Pscyhaitry Code Status: Full  Pt Overview and Major Events to Date:  3/25: Admitted, IVC placed  Assessment and Plan: Donald Curry a 25 y.o.malepresenting with altered mental status due to substance vs. New onset mood disorder. PMH is significant for ADHD.  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  -Psychiatry consulted and signed off, recommended discontinuation of Haldol given CK. Recommended rescinding IVC and sitter. Given patient's clinical state, family requesting second opinion. Attending discussed with Dr. Lucianne Muss- planned to see patient this am, but per family psych has not come by yet.  - Tylenol 650 q 6prn  - Ativan 2mg  prn agitation  Bipolar disorder Longstanding history. Psych consulted and recommended depakote. -Continue depakote 500 mg bid, will continue on discharge   Elevated CK CK [phone]. Previously discussed with Psychiatry--likely due to Haloperidol. Haldol discontinued on 3/30. As clinically well, creatinine normal, and voiding will reduce fluids and monitor X 1 more day.  - mIVF 137ml/h, decrease as appropriate   Hypokalemia Potassium 3.3, has had multiple episodes of hypokalemia throughout this hospitalization. -repletion as appropriate  -consider sending home on potassium   THC Use  UDS admission positive for THC.  Patient mentioned taking delta 10 THC to neurology during admission. -continue to encourage cessation  ADHD No medications per chart review.   FEN/GI: regular  PPx: lovenix    Status is: Inpatient    Dispo:  Patient From: Home  Planned Disposition:  Home  Medically stable for discharge: No       Subjective:  No significant overnight events reported. Patient doing well, denies any concerns today. Sitter present.   Objective: Temp:  [98.1 F (36.7 C)-98.6 F (37 C)] 98.2 F (36.8 C) (04/01 0444) Pulse Rate:  [84-103] 96 (04/01 0444) Resp:  [18-20] 18 (04/01 0444) BP: (127-136)/(73-88) 132/78 (04/01 0444) SpO2:  [98 %-100 %] 100 % (04/01 0444) Physical Exam: General: Patient well-appearing, in no acute distress. Cardiovascular: RRR, no murmurs or gallops auscultated  Respiratory: CTAB, no rhonchi or rales noted  Abdomen: soft, nontender, nondistended, BS+ Extremities: no LE edema, radial and distal pulses strong and equal bilaterally Neuro: AOx4, gross sensation intact, 5/5 UE and LE strength bilaterally, CN 2-12 grossly intact  Psych: mood appropriate, pleasant, denies SI no auditory or visual hallucinations  Laboratory: Recent Labs  Lab 06/27/20 0141 06/28/20 0605 06/30/20 0408  WBC 11.1* 11.9* 11.1*  HGB 14.2 14.2 14.5  HCT 41.5 40.8 42.9  PLT 325 327 364   Recent Labs  Lab 06/25/20 0651 06/25/20 0955 06/26/20 0207 06/26/20 1441 06/29/20 1519 06/30/20 0408 07/02/20 0436  NA 136   < > 136   < > 137 136 139  K 3.3*   < > 3.2*   < > 3.2* 3.3* 3.3*  CL 102   < > 105   < > 103 105 105  CO2 17*   < > 20*   < > 23 23 25   BUN 10   < > 7   < > 5* <5* 6  CREATININE 0.99   < > 0.78   < > 0.77 0.72 0.78  CALCIUM 9.5   < > 8.6*   < > 8.9 9.0 8.9  PROT 8.6*  --  7.0  --   --   --  6.5  BILITOT 1.4*  --  1.3*  1.4*  --   --   --  0.9  ALKPHOS 81  --  64  --   --   --  63  ALT 25  --  27  --   --   --  44  AST 32  --  39  --   --   --  53*  GLUCOSE 88   < > 96   < > 99 101* 87   < > = values in this interval not displayed.      Imaging/Diagnostic Tests: No results found.  Reece Leader, DO 07/02/2020, 6:29 AM PGY-1, [facility] Family Medicine FPTS Intern pager: [phone], text pages welcome

## 2020-07-03 DIAGNOSIS — F39 Unspecified mood [affective] disorder: Secondary | ICD-10-CM | POA: Diagnosis not present

## 2020-07-03 DIAGNOSIS — R4182 Altered mental status, unspecified: Secondary | ICD-10-CM | POA: Diagnosis not present

## 2020-07-03 DIAGNOSIS — F12988 Cannabis use, unspecified with other cannabis-induced disorder: Secondary | ICD-10-CM | POA: Diagnosis not present

## 2020-07-03 DIAGNOSIS — G0490 Encephalitis and encephalomyelitis, unspecified: Secondary | ICD-10-CM | POA: Diagnosis not present

## 2020-07-03 LAB — COMPREHENSIVE METABOLIC PANEL
ALT: 42 U/L (ref 0–44)
AST: 52 U/L — ABNORMAL HIGH (ref 15–41)
Albumin: 3.6 g/dL (ref 3.5–5.0)
Alkaline Phosphatase: 58 U/L (ref 38–126)
Anion gap: 11 (ref 5–15)
BUN: 8 mg/dL (ref 6–20)
CO2: 20 mmol/L — ABNORMAL LOW (ref 22–32)
Calcium: 8.6 mg/dL — ABNORMAL LOW (ref 8.9–10.3)
Chloride: 105 mmol/L (ref 98–111)
Creatinine, Ser: 0.7 mg/dL (ref 0.61–1.24)
GFR, Estimated: >60 mL/min (ref >60)
Glucose, Bld: 115 mg/dL — ABNORMAL HIGH (ref 70–99)
Potassium: 3 mmol/L — ABNORMAL LOW (ref 3.5–5.1)
Sodium: 136 mmol/L (ref 135–145)
Total Bilirubin: 0.7 mg/dL (ref 0.3–1.2)
Total Protein: 6.4 g/dL — ABNORMAL LOW (ref 6.5–8.1)

## 2020-07-03 LAB — CK
Total CK: 2164 U/L — ABNORMAL HIGH (ref 49–397)
Total CK: 1959 U/L — ABNORMAL HIGH (ref 49–397)

## 2020-07-03 LAB — HEMOGLOBIN A1C
Hgb A1c MFr Bld: 5.1 % (ref 4.8–5.6)
Mean Plasma Glucose: 99.67 mg/dL

## 2020-07-03 MED ORDER — POTASSIUM CHLORIDE ER 20 MEQ PO TBCR: 40.0000 meq | EXTENDED_RELEASE_TABLET | Freq: Every day | ORAL | 0 refills | Status: AC

## 2020-07-03 MED ORDER — POTASSIUM CHLORIDE 20 MEQ PO PACK
60.0000 meq | PACK | Freq: Once | ORAL | Status: AC
  Administered 2020-07-03: 60 meq via ORAL
  Filled 2020-07-03: qty 3

## 2020-07-03 NOTE — Progress Notes (Signed)
Family Medicine Teaching Service Daily Progress Note Intern Pager: [phone]  Patient name: Donald Curry Medical record number: [id number] Date of birth: 12-04-1995 Age: 25 y.o. Gender: male  Primary Care Provider: Patient, No Pcp Per (Inactive) Consultants: Psychiatry Code Status: Full  Pt Overview and Major Events to Date:  3/25: Admitted, IVC placed 4/1: IVC expired   Assessment and Plan: Donald Curry a 24 y.o.malepresenting with altered mental status due to substance vs. New onset mood disorder. PMH is significant for ADHD.  Altered mental status AcuteEncephalopathy  Cannabis induced psychosis  Mental status has returned back to baseline.Likely psychiatric in origin, triggered by THC/drug use.  -Psychiatry consulted and signed off, recommended discontinuation of Haldol given CK. Recommended rescinding IVC and sitter. Given patient's clinical state, family requesting second opinion. - Tylenol 650 q 6prn  - Ativan 2mg  prn agitation  Bipolar disorder Longstanding history. Psych consulted and recommended depakote. -Continue depakote 500 mg bid, will continue on discharge   Elevated CK CK [phone]. Likely due to previously prescribed haldol which was discontinued on 3/30.  - mIVF 166ml/h, decrease as appropriate   Hypokalemia Potassium 3.0, has had multiple episodes of hypokalemia throughout this hospitalization. -appropriate repletion ordered  -consider sending home on potassium  THC Use  UDS admission positive for THC. Patient mentioned taking delta 10 THC to neurology during admission. Discussed recommendation for [facility] cessation with patient.  -continue to encourage cessation  ADHD No medications per chart review.   FEN/GI: regular PPx: lovenox    Status is: Inpatient    Dispo:  Patient From: Home  Planned Disposition: Home  Medically stable for discharge: No          Subjective:  No overnight events reported. Patient  denies myalgias today, states that he has been up walking around with no issues. Reports that his myalgias have improved since last night.   Objective: Temp:  [98 F (36.7 C)-98.2 F (36.8 C)] 98.1 F (36.7 C) (04/02 0330) Pulse Rate:  [78-111] 89 (04/02 0330) Resp:  [16-20] 16 (04/02 0330) BP: (121-147)/(64-76) 133/69 (04/02 0330) SpO2:  [99 %-100 %] 100 % (04/02 0330) Physical Exam: General: Patient sitting upright in bed, in no acute distress. Cardiovascular: RRR, no murmurs or gallops auscultated  Respiratory: CTAB, no wheezing, rales or rhonchi noted, good air movement noted throughout all lung fields Abdomen: soft, nontender, presence of active bowel sounds Extremities: no LE edema noted bilaterally, radial and distal pulses strong and equal bilaterally Neuro: AOx4, conversational Psych: mood appropriate  Laboratory: Recent Labs  Lab 06/27/20 0141 06/28/20 0605 06/30/20 0408  WBC 11.1* 11.9* 11.1*  HGB 14.2 14.2 14.5  HCT 41.5 40.8 42.9  PLT 325 327 364   Recent Labs  Lab 06/30/20 0408 07/02/20 0436 07/03/20 0250  NA 136 139 136  K 3.3* 3.3* 3.0*  CL 105 105 105  CO2 23 25 20*  BUN <5* 6 8  CREATININE 0.72 0.78 0.70  CALCIUM 9.0 8.9 8.6*  PROT  --  6.5 6.4*  BILITOT  --  0.9 0.7  ALKPHOS  --  63 58  ALT  --  44 42  AST  --  53* 52*  GLUCOSE 101* 87 115*      Imaging/Diagnostic Tests: No results found.  09/02/20, DO 07/03/2020, 6:47 AM PGY-1, [facility] Family Medicine FPTS Intern pager: [phone], text pages welcome

## 2020-07-03 NOTE — Progress Notes (Signed)
walked pt off the unit with all his things with his father by his side. IV removed CCMD notified of discharge.
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
## 2020-07-03 NOTE — Progress Notes (Signed)
FPTS Interim Progress Note  S: Received page from nurse that patient seems to be agitated. Went to bedside to evaluation patient. Both parents present at bedside. He initially noted to be frustrated stating that "I am disappointed with this hospital but I appreciate everyone who has cared for me." A few minutes later, patient soon began to calm down and states that he feels fine and states "I have been ready to go home since I got here."   O: BP (!) 149/95 (BP Location: Right Arm)   Pulse 92   Temp 98.1 F (36.7 C) (Oral)   Resp 16   Ht 6\' 1"  (1.854 m)   Wt 135.1 kg   SpO2 98%   BMI 39.29 kg/m   General: Patient sitting in a chair, in no acute distress. Resp: normal work of breathing Neuro: AOx4, appropriately conversational Psych: mild agitation noted but pleasant within minutes, denies SI and HI, denies auditory and visual hallucinations, maintains capacity   A/P: -Patient continues to maintain capacity to make his own decisions.  -Discussed with Dr. , will continue to plan for discharge per patient's preference. Follow up plan discussed extensively with patient, patient voices understanding and is in agreement. Parents agree as well.  -Follow up at [facility], appointments scheduled and discussed with patient and his parents.   [facility], DO 07/03/2020, 4:31 PM PGY-1, [facility] Family Medicine Service pager [phone]

## 2020-07-03 NOTE — Discharge Instructions (Signed)
You were hospitalized at [facility] due to confusion and elevated creatinine kinase.  We expect this is from [facility] use which improved after fluids  We are so glad you are feeling better.  Be sure to follow-up with your regularly scheduled appointments.  Please also be sure to follow-up with our clinic/PCP on 4/4 at 10 am for lab work and 4/7 at 9:50 am for doctor's appointment. Please schedule an appointment with your PCP at your earliest convenience for further follow up. Please ensure that you are maintaining good hydration. Thank you for allowing [place] to be a part of your medical care. Please pick up your potassium supplement at the pharmacy.  Take care, [facility] family medicine team

## 2020-07-03 NOTE — Progress Notes (Signed)
FPTS Interim Progress Note  Given patient's consistent elevated creatinine kinase in spite of continued IVF, contacted nephrology for further possible recommendations. Spoke with Dr. Glenna Fellows who recommends that given no AKI or other renal injury and asymptomatic, will encourage oral hydration and outpatient follow up. Although no concern for autoimmune conditions, recommends muscle biopsy if CK remains persistently elevated. Will discontinue IVF and plan for discharge. Appreciate nephrology's recommendations.   Reece Leader, DO 07/03/2020, 11:10 AM PGY-1, [facility] Family Medicine Service pager [phone]

## 2020-07-05 ENCOUNTER — Other Ambulatory Visit: Payer: 59

## 2020-07-05 ENCOUNTER — Other Ambulatory Visit: Payer: Self-pay

## 2020-07-05 DIAGNOSIS — R748 Abnormal levels of other serum enzymes: Secondary | ICD-10-CM

## 2020-07-05 LAB — BENZODIAZEPINES,MS,WB/SP RFX
7-Aminoclonazepam: NEGATIVE ng/mL
Alprazolam: NEGATIVE ng/mL
Benzodiazepines Confirm: POSITIVE
Chlordiazepoxide: NEGATIVE
Clonazepam: NEGATIVE ng/mL
Desalkylflurazepam: NEGATIVE ng/mL
Desmethylchlordiazepoxide: NEGATIVE
Desmethyldiazepam: NEGATIVE ng/mL
Diazepam: NEGATIVE ng/mL
Flurazepam: NEGATIVE ng/mL
Lorazepam: NEGATIVE ng/mL
Midazolam: 5.2 ng/mL
Oxazepam: NEGATIVE ng/mL
Temazepam: NEGATIVE ng/mL
Triazolam: NEGATIVE ng/mL

## 2020-07-06 ENCOUNTER — Telehealth: Payer: Self-pay | Admitting: *Deleted

## 2020-07-06 LAB — BASIC METABOLIC PANEL
BUN/Creatinine Ratio: 12 (ref 9–20)
BUN: 9 mg/dL (ref 6–20)
CO2: 20 mmol/L (ref 20–29)
Calcium: 9.6 mg/dL (ref 8.7–10.2)
Chloride: 100 mmol/L (ref 96–106)
Creatinine, Ser: 0.73 mg/dL — ABNORMAL LOW (ref 0.76–1.27)
Glucose: 84 mg/dL (ref 65–99)
Potassium: 4.5 mmol/L (ref 3.5–5.2)
Sodium: 138 mmol/L (ref 134–144)
eGFR: 132 mL/min/{1.73_m2} (ref >59)

## 2020-07-06 LAB — CK: Total CK: 539 U/L (ref 49–439)

## 2020-07-06 NOTE — Telephone Encounter (Signed)
Attempted to call patient x1, but VM is full and unable to leave message. kidney function stable and CK downtrending, no changes in management, pt has appt on 4/7.  Burley Saver MD

## 2020-07-06 NOTE — Telephone Encounter (Signed)
Pt called back and LM on nurse line.  Attempted to call back but same response as Dr. Miquel Dunn.  Will await callback. Jone Baseman, CMA

## 2020-07-08 ENCOUNTER — Ambulatory Visit (INDEPENDENT_AMBULATORY_CARE_PROVIDER_SITE_OTHER): Payer: 59 | Admitting: Family Medicine

## 2020-07-08 ENCOUNTER — Other Ambulatory Visit: Payer: Self-pay

## 2020-07-08 VITALS — BP 137/94 | HR 105 | Ht 73.0 in | Wt 300.1 lb

## 2020-07-08 DIAGNOSIS — E876 Hypokalemia: Secondary | ICD-10-CM | POA: Diagnosis not present

## 2020-07-08 DIAGNOSIS — E669 Obesity, unspecified: Secondary | ICD-10-CM

## 2020-07-08 DIAGNOSIS — R748 Abnormal levels of other serum enzymes: Secondary | ICD-10-CM | POA: Diagnosis not present

## 2020-07-08 DIAGNOSIS — R4182 Altered mental status, unspecified: Secondary | ICD-10-CM

## 2020-07-08 NOTE — Progress Notes (Signed)
    SUBJECTIVE:   CHIEF COMPLAINT / HPI:   Hospital f/u  Patient is here for hospital follow-up after being admitted for altered mental status.  He reports that since his discharge he has been taking his Depakote as prescribed but he does not feel that he needs medication.  His girlfriend is with him and reports that she feels he is back to his normal baseline and that she is making sure that he takes his medications as prescribed.  He is extremely tangential throughout the visit and verbose.  He has pressured speech.  He reports that they are scheduling him a psychiatric visit and he is trying to establish care with a primary care provider in [facility] because that is where he lives.  He reports that he will go to the psychiatric visit but he is concerned that he will have trouble finding a psychiatric provider who can connect with him because of his high functioning math ability.  We discussed how important it is that the patient follow-up with a psychiatric provider to help fine-tune his medications if possible.  Patient agrees to this.  Patient denies any thoughts of hurting himself or hurting others.   OBJECTIVE:   BP (!) 137/94   Pulse (!) 105   Ht 6\' 1"  (1.854 m)   Wt (!) 300 lb 2 oz (136.1 kg)   SpO2 98%   BMI 39.60 kg/m   General: 25 year old male in no acute distress Cardiac: Regular rate and rhythm, no murmurs appreciated Respiratory: Normal [place] the  Psych: Tangential thoughts, pressured speech, grandiose ideas, denies any SI or HI.  Does show minor signs of paranoia  ASSESSMENT/PLAN:   Altered mental status Patient presents with his girlfriend and appears to be doing well at this time.  He is compliant with his Depakote and they are working on establishing with a primary care provider and 23 and a psychiatric provider as well.  After frank discussion regarding his psychiatric care patient agrees to go to a psychiatric visit.  No SI or HI at this time.  Signs of  hypomania noted but patient's girlfriend reports this is his baseline.  A records release form was signed today.  We also we collected a BMP to check his potassium because he had been taking potassium supplementation and a CK because his CK was extremely elevated when he was hospitalized.  I will call with those results when they come back.     [employer], MD [facility]

## 2020-07-08 NOTE — Patient Instructions (Addendum)
It was nice meeting you today.  I am glad you are feeling a little bit better.  Please be sure to follow-up with psychiatry regarding your medication use.  I want to recheck your CK as well as your potassium levels.  I am also going to check a lipid panel because this may affect the medications you are on or may be put on.  Regarding taking potassium I like for you to stop taking the potassium at this time and if your levels are lower I can call you and tell you to restart it.  I would also like for you to come back in 1 week for a repeat check of your potassium.  If you have any questions or concerns please call the clinic.  Happy of wonderful afternoon!     Potassium Content of Foods  Potassium is a mineral found in many foods and drinks. It affects how the heart works, and helps keep fluids and minerals balanced in the body. The amount of potassium you need each day depends on your age and any medical conditions you may have. Talk to your health care provider or dietitian about how much potassium you need. The following lists of foods provide the general serving size for foods and the approximate amount of potassium in each serving, listed in milligrams (mg). Actual values may vary depending on the product and how it is processed. High in potassium The following foods and beverages have 200 mg or more of potassium per serving:  Apricots (raw) - 2 have 200 mg of potassium.  Apricots (dry) - 5 have 200 mg of potassium.  Artichoke - 1 medium has 345 mg of potassium.  Avocado -  fruit has 245 mg of potassium.  Banana - 1 medium fruit has 425 mg of potassium.  Lima or baked beans (canned) -  cup has 280 mg of potassium.  White beans (canned) -  cup has 595 mg potassium.  Beef roast - 3 oz has 320 mg of potassium.  Ground beef - 3 oz has 270 mg of potassium.  Beets (raw or cooked) -  cup has 260 mg of potassium.  Bran muffin - 2 oz has 300 mg of potassium.  Broccoli (cooked) -   cup has 230 mg of potassium.  Brussels sprouts -  cup has 250 mg of potassium.  Cantaloupe -  cup has 215 mg of potassium.  Cereal, 100% bran -  cup has 200-400 mg of potassium.  Cheeseburger -1 single fast food burger has 225-400 mg of potassium.  Chicken - 3 oz has 220 mg of potassium.  Clams (canned) - 3 oz has 535 mg of potassium.  Crab - 3 oz has 225 mg of potassium.  Dates - 5 have 270 mg of potassium.  Dried beans and peas -  cup has 300-475 mg of potassium.  Figs (dried) - 2 have 260 mg of potassium.  Fish (halibut, tuna, cod, snapper) - 3 oz has 480 mg of potassium.  Fish (salmon, haddock, swordfish, perch) - 3 oz has 300 mg of potassium.  Fish (tuna, canned) - 3 oz has 200 mg of potassium.  [place] fries (fast food) - 3 oz has 470 mg of potassium.  Granola with fruit and nuts -  cup has 200 mg of potassium.  Grapefruit juice -  cup has 200 mg of potassium.  Honeydew melon -  cup has 200 mg of potassium.  Kale (raw) - 1 cup has 300 mg of potassium.  Kiwi - 1 medium fruit has 240 mg of potassium.  Kohlrabi, rutabaga, parsnips -  cup has 280 mg of potassium.  Lentils -  cup has 365 mg of potassium.  Mango - 1 each has 325 mg of potassium.  Milk (nonfat, low-fat, whole, buttermilk) - 1 cup has 350-380 mg of potassium.  Milk (chocolate) - 1 cup has 420 mg of potassium  Molasses - 1 Tbsp has 295 mg of potassium.  Mushrooms -  cup has 280 mg of potassium.  Nectarine - 1 each has 275 mg of potassium.  Nuts (almonds, peanuts, hazelnuts, [place], cashew, mixed) - 1 oz has 200 mg of potassium.  Nuts (pistachios) - 1 oz has 295 mg of potassium.  Orange - 1 fruit has 240 mg of potassium.  Orange juice -  cup has 235 mg of potassium.  Papaya -  medium fruit has 390 mg of potassium.  Peanut butter (chunky) - 2 Tbsp has 240 mg of potassium.  Peanut butter (smooth) - 2 Tbsp has 210 mg of potassium.  Pear - 1 medium (200 mg) of  potassium.  Pomegranate - 1 whole fruit has 400 mg of potassium.  Pomegranate juice -  cup has 215 mg of potassium.  Pork - 3 oz has 350 mg of potassium.  Potato chips (salted) - 1 oz has 465 mg of potassium.  Potato (baked with skin) - 1 medium has 925 mg of potassium.  Potato (boiled) -  cup has 255 mg of potassium.  Potato (Mashed) -  cup has 330 mg of potassium.  Prune juice -  cup has 370 mg of potassium.  Prunes - 5 have 305 mg of potassium.  Pudding (chocolate) -  cup has 230 mg of potassium.  Pumpkin (canned) -  cup has 250 mg of potassium.  Raisins (seedless) -  cup has 270 mg of potassium.  Seeds (sunflower or pumpkin) - 1 oz has 240 mg of potassium.  Soy milk - 1 cup has 300 mg of potassium.  Spinach (cooked) - 1/2 cup has 420 mg of potassium.  Spinach (canned) -  cup has 370 mg of potassium.  Sweet potato (baked with skin) - 1 medium has 450 mg of potassium.  Swiss chard -  cup has 480 mg of potassium.  Tomato or vegetable juice -  cup has 275 mg of potassium.  Tomato (sauce or puree) -  cup has 400-550 mg of potassium.  Tomato (raw) - 1 medium has 290 mg of potassium.  Tomato (canned) -  cup has 200-300 mg of potassium.  [place]urkey - 3 oz has 250 mg of potassium.  Wheat germ - 1 oz has 250 mg of potassium.  Winter squash -  cup has 250 mg of potassium.  Yogurt (plain or fruited) - 6 oz has 260-435 mg of potassium.  Zucchini -  cup has 220 mg of potassium. Moderate in potassium The following foods and beverages have 50-200 mg of potassium per serving:  Apple - 1 fruit has 150 mg of potassium  Apple juice -  cup has 150 mg of potassium  Applesauce -  cup has 90 mg of potassium  Apricot nectar -  cup has 140 mg of potassium  Asparagus (small spears) -  cup has 155 mg of potassium  Asparagus (large spears) - 6 have 155 mg of potassium  Bagel (cinnamon raisin) - 1 four-inch bagel has 130 mg of potassium  Bagel (egg or  plain) - 1 four- inch bagel  has 70 mg of potassium  Beans (green) -  cup has 90 mg of potassium  Beans (yellow) -  cup has 190 mg of potassium  Beer, regular - 12 oz has 100 mg of potassium  Beets (canned) -  cup has 125 mg of potassium  Blackberries -  cup has 115 mg of potassium  Blueberries -  cup has 60 mg of potassium  Bread (whole wheat) - 1 slice has 70 mg of potassium  Broccoli (raw) -  cup has 145 mg of potassium  Cabbage -  cup has 150 mg of potassium  Carrots (cooked or raw) -  cup has 180 mg of potassium  Cauliflower (raw) -  cup has 150 mg of potassium  Celery (raw) -  cup has 155 mg of potassium  Cereal, bran flakes -  cup has 120-150 mg of potassium  Cheese (cottage) -  cup has 110 mg of potassium  Cherries - 10 have 150 mg of potassium  Chocolate - 1 oz bar has 165 mg of potassium  Coffee (brewed) - 6 oz has 90 mg of potassium  Corn -  cup or 1 ear has 195 mg of potassium  Cucumbers -  cup has 80 mg of potassium  Egg - 1 large egg has 60 mg of potassium  Eggplant -  cup has 60 mg of potassium  Endive (raw) -  cup has 80 mg of potassium  English muffin - 1 has 65 mg of potassium  Fish (ocean perch) - 3 oz has 192 mg of potassium  Frankfurter, beef or pork - 1 has 75 mg of potassium  Fruit cocktail -  cup has 115 mg of potassium  Grape juice -  cup has 170 mg of potassium  Grapefruit -  fruit has 175 mg of potassium  Grapes -  cup has 155 mg of potassium  Greens: kale, turnip, collard -  cup has 110-150 mg of potassium  Ice cream or frozen yogurt (chocolate) -  cup has 175 mg of potassium  Ice cream or frozen yogurt (vanilla) -  cup has 120-150 mg of potassium  Lemons, limes - 1 each has 80 mg of potassium  Lettuce - 1 cup has 100 mg of potassium  Mixed vegetables -  cup has 150 mg of potassium  Mushrooms, raw -  cup has 110 mg of potassium  Nuts (walnuts, pecans, or macadamia) - 1 oz has 125 mg of  potassium  Oatmeal -  cup has 80 mg of potassium  Okra -  cup has 110 mg of potassium  Onions -  cup has 120 mg of potassium  Peach - 1 has 185 mg of potassium  Peaches (canned) -  cup has 120 mg of potassium  Pears (canned) -  cup has 120 mg of potassium  Peas, green (frozen) -  cup has 90 mg of potassium  Peppers (Green) -  cup has 130 mg of potassium  Peppers (Red) -  cup has 160 mg of potassium  Pineapple juice -  cup has 165 mg of potassium  Pineapple (fresh or canned) -  cup has 100 mg of potassium  Plums - 1 has 105 mg of potassium  Pudding, vanilla -  cup has 150 mg of potassium  Raspberries -  cup has 90 mg of potassium  Rhubarb -  cup has 115 mg of potassium  Rice, wild -  cup has 80 mg of potassium  Shrimp - 3 oz has 155  mg of potassium  Spinach (raw) - 1 cup has 170 mg of potassium  Strawberries -  cup has 125 mg of potassium  Summer squash -  cup has 175-200 mg of potassium  Swiss chard (raw) - 1 cup has 135 mg of potassium  Tangerines - 1 fruit has 140 mg of potassium  Tea, brewed - 6 oz has 65 mg of potassium  Turnips -  cup has 140 mg of potassium  Watermelon -  cup has 85 mg of potassium  Wine (Red, table) - 5 oz has 180 mg of potassium  Wine (White, table) - 5 oz 100 mg of potassium Low in potassium The following foods and beverages have less than 50 mg of potassium per serving.  Bread (white) - 1 slice has 30 mg of potassium  Carbonated beverages - 12 oz has less than 5 mg of potassium  Cheese - 1 oz has 20-30 mg of potassium  Cranberries -  cup has 45 mg of potassium  Cranberry juice cocktail -  cup has 20 mg of potassium  Fats and oils - 1 Tbsp has less than 5 mg of potassium  Hummus - 1 Tbsp has 32 mg of potassium  Nectar (papaya, mango, or pear) -  cup has 35 mg of potassium  Rice (white or brown) -  cup has 50 mg of potassium  Spaghetti or macaroni (cooked) -  cup has 30 mg of  potassium  Tortilla, flour or corn - 1 has 50 mg of potassium  Waffle - 1 four-inch waffle has 50 mg of potassium  Water chestnuts -  cup has 40 mg of potassium Summary  Potassium is a mineral found in many foods and drinks. It affects how the heart works, and helps keep fluids and minerals balanced in the body.  The amount of potassium you need each day depends on your age and any existing medical conditions you may have. Your health care provider or dietitian may recommend an amount of potassium that you should have each day. This information is not intended to replace advice given to you by your health care provider. Make sure you discuss any questions you have with your health care provider. Document Revised: 03/02/2017 Document Reviewed: 06/14/2016 Elsevier Patient Education  2021 [employer].

## 2020-07-08 NOTE — Progress Notes (Deleted)
    SUBJECTIVE:   CHIEF COMPLAINT / HPI:   ***  PERTINENT  PMH / PSH: ***  OBJECTIVE:   BP (!) 137/94   Pulse (!) 105   Ht 6\' 1"  (1.854 m)   Wt (!) 300 lb 2 oz (136.1 kg)   SpO2 98%   BMI 39.60 kg/m   ***  ASSESSMENT/PLAN:   No problem-specific Assessment & Plan notes found for this encounter.     , MD [facility]   {    This will disappear when note is signed, click to select method of visit    :1}

## 2020-07-09 LAB — LIPID PANEL
Chol/HDL Ratio: 3 ratio (ref 0.0–5.0)
Cholesterol, Total: 130 mg/dL (ref 100–199)
HDL: 43 mg/dL (ref >39)
LDL Chol Calc (NIH): 71 mg/dL (ref 0–99)
Triglycerides: 82 mg/dL (ref 0–149)
VLDL Cholesterol Cal: 16 mg/dL (ref 5–40)

## 2020-07-09 LAB — BASIC METABOLIC PANEL
BUN/Creatinine Ratio: 15 (ref 9–20)
BUN: 10 mg/dL (ref 6–20)
CO2: 19 mmol/L — ABNORMAL LOW (ref 20–29)
Calcium: 9.6 mg/dL (ref 8.7–10.2)
Chloride: 100 mmol/L (ref 96–106)
Creatinine, Ser: 0.68 mg/dL — ABNORMAL LOW (ref 0.76–1.27)
Glucose: 90 mg/dL (ref 65–99)
Potassium: 4.9 mmol/L (ref 3.5–5.2)
Sodium: 137 mmol/L (ref 134–144)
eGFR: 135 mL/min/{1.73_m2} (ref >59)

## 2020-07-09 LAB — CK: Total CK: 183 U/L (ref 49–439)

## 2020-07-09 NOTE — Assessment & Plan Note (Signed)
Patient presents with his girlfriend and appears to be doing well at this time.  He is compliant with his Depakote and they are working on establishing with a primary care provider and [employer] and a psychiatric provider as well.  After frank discussion regarding his psychiatric care patient agrees to go to a psychiatric visit.  No SI or HI at this time.  Signs of hypomania noted but patient's girlfriend reports this is his baseline.  A records release form was signed today.  We also we collected a BMP to check his potassium because he had been taking potassium supplementation and a CK because his CK was extremely elevated when he was hospitalized.  I will call with those results when they come back.

## 2020-07-12 LAB — THC,MS,WB/SP RFX
Cannabidiol: NEGATIVE ng/mL
Cannabinol: NEGATIVE ng/mL

## 2020-07-13 LAB — DRUG SCREEN 10 W/CONF, SERUM
Amphetamines, IA: NEGATIVE ng/mL
Barbiturates, IA: NEGATIVE ug/mL
Benzodiazepines, IA: POSITIVE ng/mL — AB
Cocaine & Metabolite, IA: NEGATIVE ng/mL
Methadone, IA: NEGATIVE ng/mL
Opiates, IA: NEGATIVE ng/mL
Oxycodones, IA: NEGATIVE ng/mL
Phencyclidine, IA: NEGATIVE ng/mL
Propoxyphene, IA: NEGATIVE ng/mL

## 2020-07-19 ENCOUNTER — Telehealth: Payer: Self-pay

## 2020-07-19 ENCOUNTER — Other Ambulatory Visit (HOSPITAL_COMMUNITY): Payer: Self-pay

## 2020-07-19 NOTE — Telephone Encounter (Signed)
Attempted to call patient to inform of below message. Unfortunately, patient did not answer, left HIPAA compliant VM for patient to return call to office.   Veronda Prude, RN

## 2020-07-19 NOTE — Telephone Encounter (Signed)
Patient calls nurse line regarding side effects from Depakote. Patient reports that he has been having darkening of urine and mild episodes of confusion since starting medication. Patient is alert and oriented during conversation. Patient is requesting to switch medications to Abilify, as he was on this medication in the past and it worked well for him. Advised patient that due to the side effects that he is experiencing, that I would recommend clinic evaluation. Patient reports that he lives very far from our office and does not want to come in as he was just seen on 4/7.   Will forward to PCP for next steps.   Veronda Prude, RN

## 2020-07-20 NOTE — Telephone Encounter (Signed)
Attempted to reach patient again this morning. No answer. Will attempt to reach patient later today. Will also reach out to patient via mychart.   Veronda Prude, RN

## 2021-09-06 ENCOUNTER — Encounter: Payer: Self-pay | Admitting: *Deleted

## 2022-11-13 ENCOUNTER — Telehealth: Payer: Self-pay

## 2022-11-13 NOTE — Transitions of Care (Post Inpatient/ED Visit) (Signed)
   11/13/2022  Name: Matao Underdown MRN: [id number] DOB: 12-May-1995  Today's TOC FU Call Status: Today's TOC FU Call Status:: Successful TOC FU Call Completed TOC FU Call Complete Date: 11/13/22  Transition Care Management Follow-up Telephone Call Date of Discharge: 11/10/22 Discharge Facility: [facility] Type of Discharge: Inpatient Admission Primary Inpatient Discharge Diagnosis:: psych exam How have you been since you were released from the hospital?: Better Any questions or concerns?: No  Items Reviewed: Did you receive and understand the discharge instructions provided?: Yes Any new allergies since your discharge?: No Dietary orders reviewed?: Yes  Medications Reviewed Today: Medications Reviewed Today     Reviewed by Karena Addison, LPN (Licensed Practical Nurse) on 11/13/22 at 1126  Med List Status: <None>   Medication Order Taking? Sig Documenting Provider Last Dose Status Informant  divalproex (DEPAKOTE) 500 MG DR tablet [id number]  Take 1 tablet (500 mg total) by mouth every 12 (twelve) hours. Jackelyn Poling, DO  Active   divalproex (DEPAKOTE) 500 MG DR tablet [id number]  TAKE 1 TABLET (500 MG TOTAL) BY MOUTH EVERY TWELVE HOURS. Jackelyn Poling, DO  Expired 07/02/21 2359   potassium chloride 20 MEQ TBCR [id number]  Take 40 mEq by mouth daily. Please take 2 tablets a day for total dose of 40 mEq. Reece Leader, DO  Active             Home Care and Equipment/Supplies: Were Home Health Services Ordered?: NA Any new equipment or medical supplies ordered?: NA  Functional Questionnaire: Do you need assistance with bathing/showering or dressing?: No Do you need assistance with meal preparation?: No Do you need assistance with eating?: No Do you have difficulty maintaining continence: No Do you need assistance with getting out of bed/getting out of a chair/moving?: No Do you have difficulty managing or taking your medications?: No  Follow up appointments  reviewed: PCP Follow-up appointment confirmed?: No (no longer a patient) MD Provider Line [phone] Given: No Specialist Hospital Follow-up appointment confirmed?: NA Do you need transportation to your follow-up appointment?: No Do you understand care options if your condition(s) worsen?: Yes-patient verbalized understanding    SIGNATURE Karena Addison, LPN [facility] Nurse Health Advisor Direct Dial [phone]

## 2023-03-16 IMAGING — MR MR HEAD WO/W CM
12 of 14 series · 32 of 48 positions shown · IV contrast (gadavist)
Comparison: None.

CLINICAL DATA: Altered mental status

EXAM:
MRI HEAD WITHOUT AND WITH CONTRAST
TECHNIQUE: Multiplanar, multiecho pulse sequences of the brain and surrounding
structures were obtained without and with intravenous contrast.
CONTRAST:  10mL GADAVIST GADOBUTROL 1 MMOL/ML IV SOLN

[Series 2: DWI · axial · 3.0mm · 0.94mm/px · z∈[-86,+65]mm · 6 of 104 slices shown (1 of 2)]
[im 1/104]
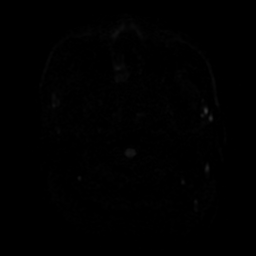
[im 21/104]
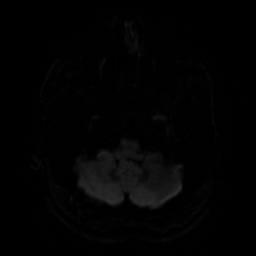
[im 42/104]
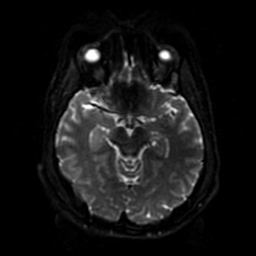
[im 62/104]
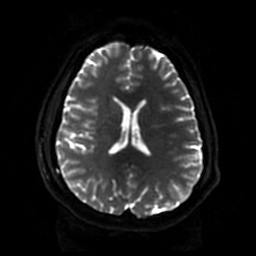
[im 83/104]
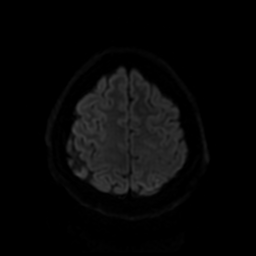
[im 104/104]
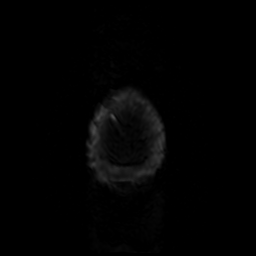

[Series 3: DWI · coronal · 4.0mm · 0.94mm/px · 5 of 74 slices shown (2 of 2)]
[im 1/74]
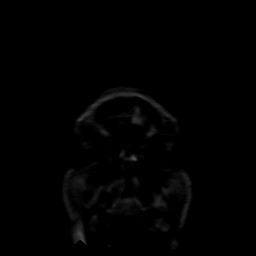
[im 19/74]
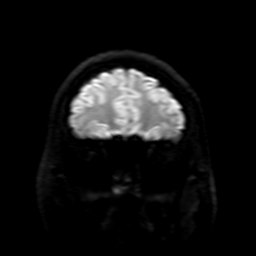
[im 37/74]
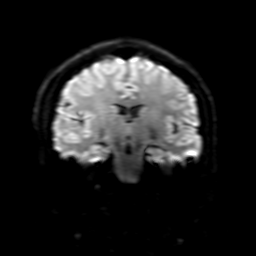
[im 55/74]
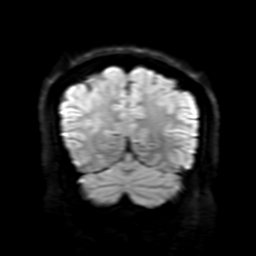
[im 74/74]
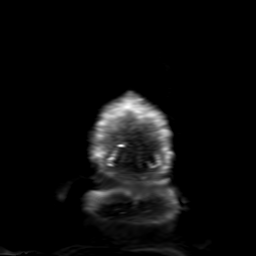

[Series 4: FLAIR · sagittal · 5.0mm · 0.47mm/px · 2 of 25 slices shown (1 of 2)]
[im 1/25]
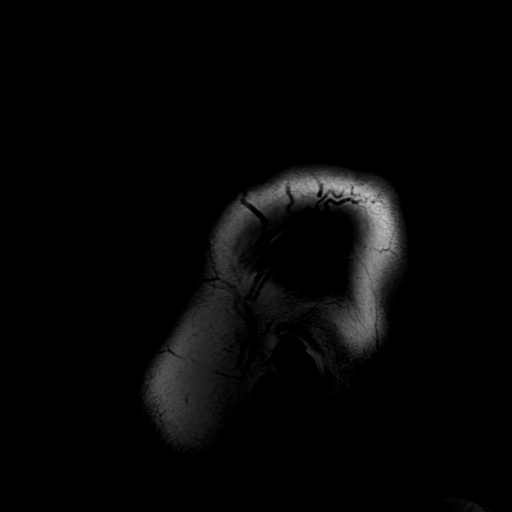
[im 25/25]
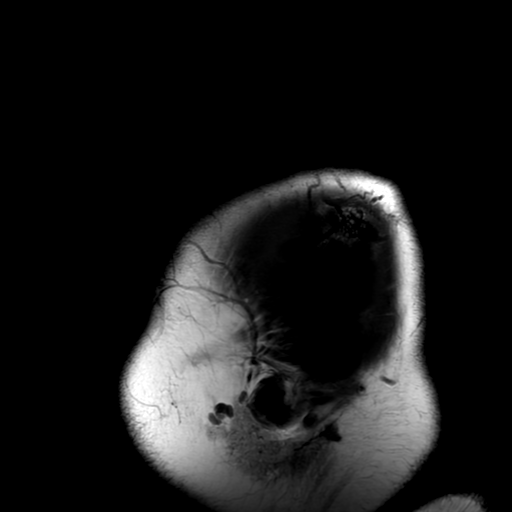

[Series 5: T2 · axial · 5.0mm · 0.47mm/px · z∈[-84,+64]mm · 2 of 26 slices shown]
[im 1/26]
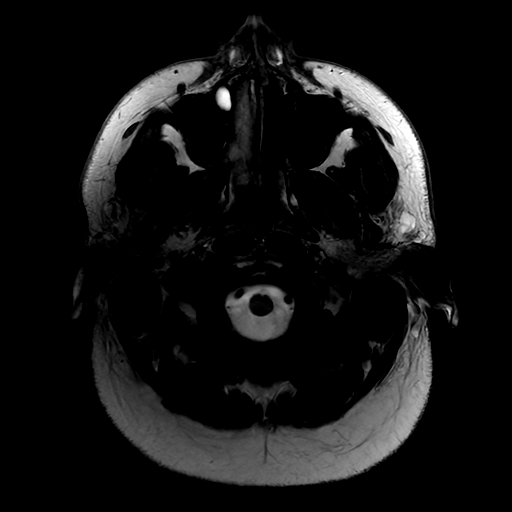
[im 26/26]
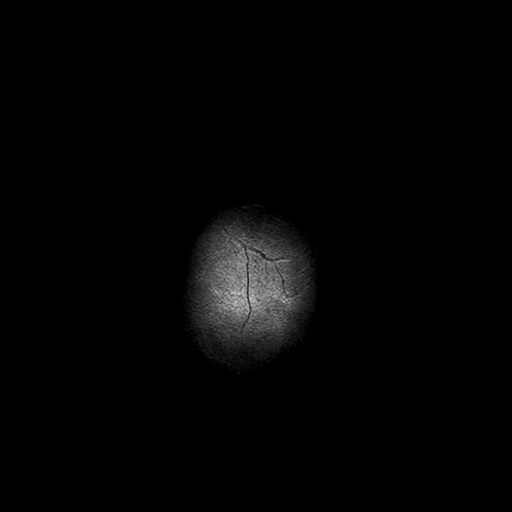

[Series 6: FLAIR · axial · 3.0mm · 0.47mm/px · z∈[-83,+65]mm · 2 of 26 slices shown (2 of 2)]
[im 1/26]
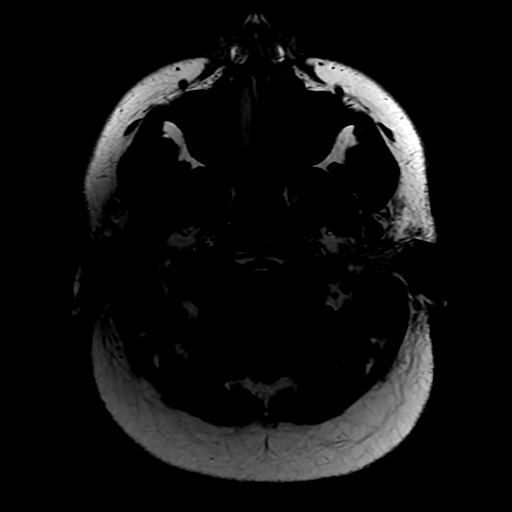
[im 26/26]
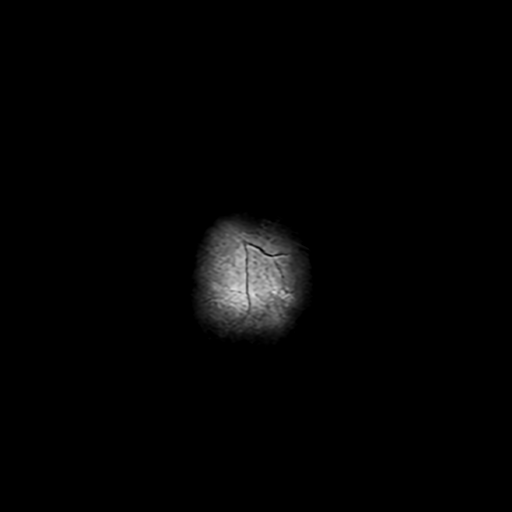

[Series 7: (person_name) · axial · 3.0mm · 0.47mm/px · 1 of 104 slices shown]
[im 1/104]
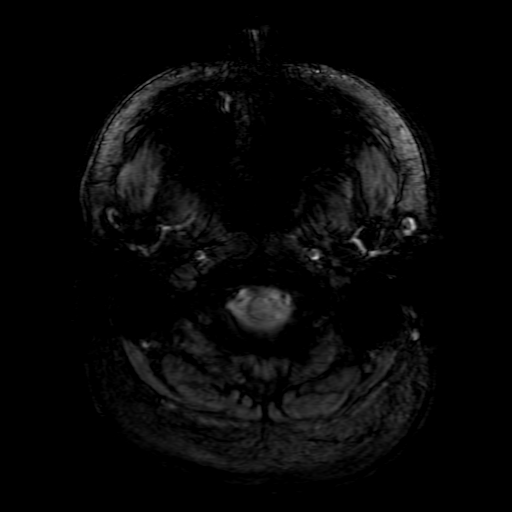

[Series 9: T2 post-contrast · coronal · 5.0mm · 0.39mm/px · 2 of 31 slices shown]
[im 1/31]
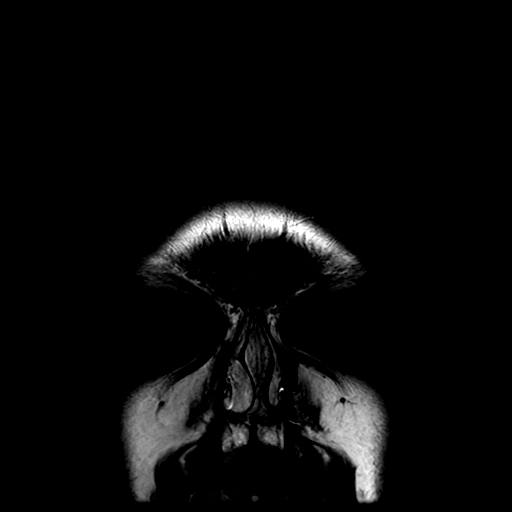
[im 31/31]
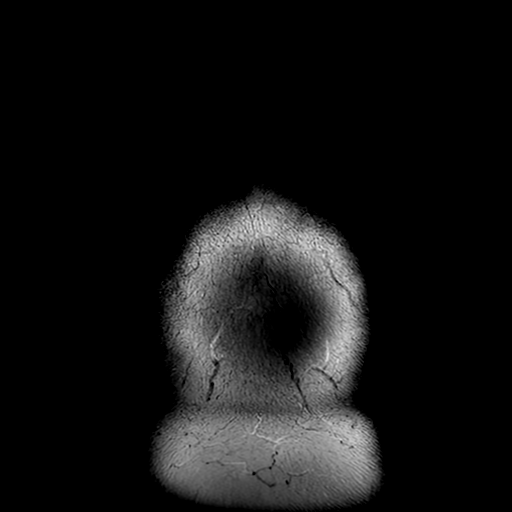

[Series 10: T1 · axial · 3.0mm · 0.94mm/px · z∈[-85,+66]mm · 3 of 52 slices shown (1 of 2)]
[im 1/52]
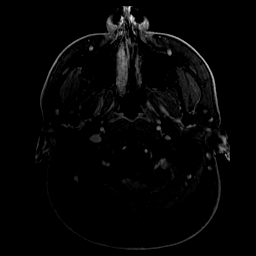
[im 26/52]
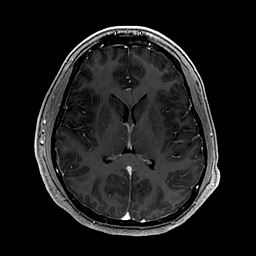
[im 52/52]
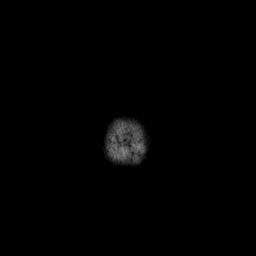

[Series 11: T1 · coronal · 5.0mm · 0.43mm/px · 2 of 31 slices shown (2 of 2)]
[im 1/31]
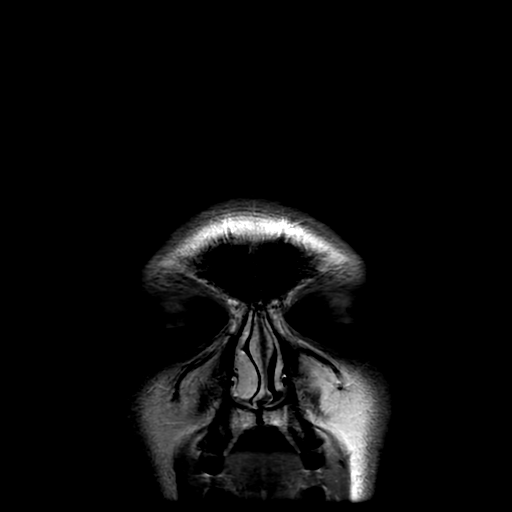
[im 31/31]
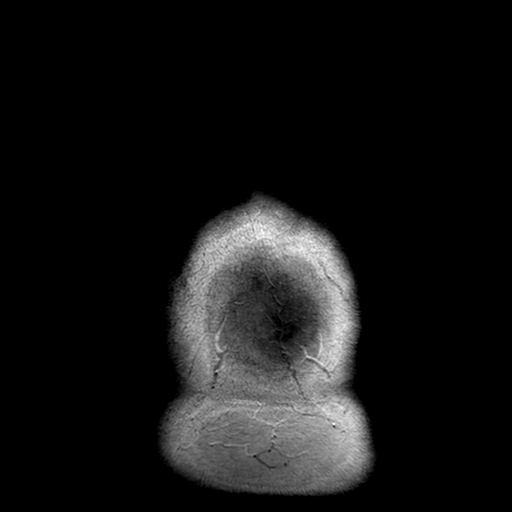

[Series 12: FLAIR post-contrast · sagittal · 5.0mm · 0.47mm/px · 2 of 25 slices shown]
[im 1/25]
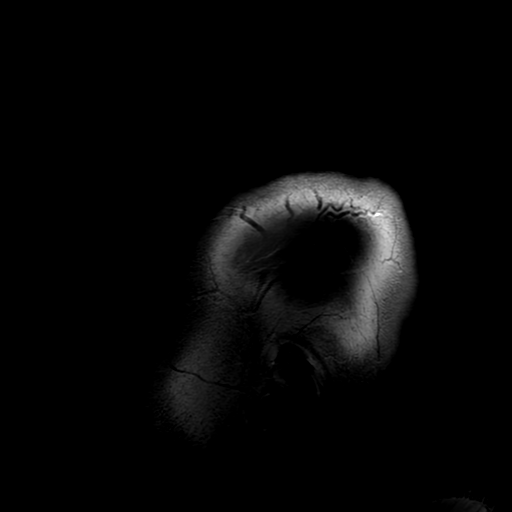
[im 25/25]
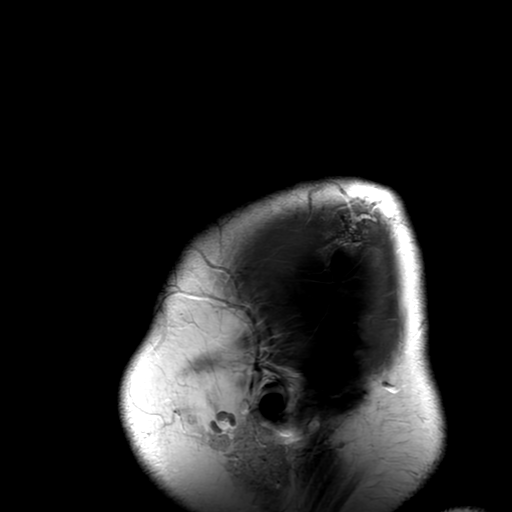

[Series 250: ADC · axial · 3.0mm · 0.94mm/px · z∈[-86,+65]mm · 3 of 52 slices shown (1 of 2)]
[im 1/52]
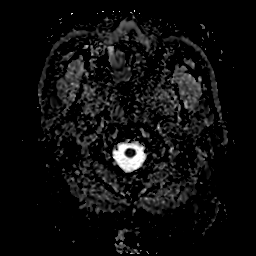
[im 26/52]
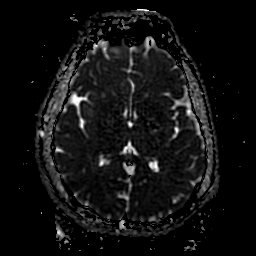
[im 52/52]
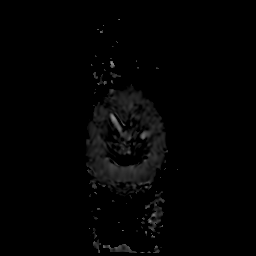

[Series 350: ADC · coronal · 4.0mm · 0.94mm/px · 2 of 37 slices shown (2 of 2)]
[im 1/37]
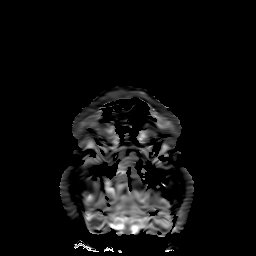
[im 37/37]
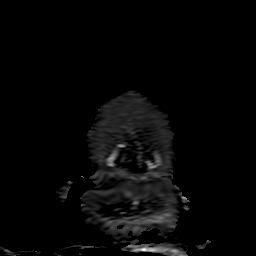

[32 of 48 positions shown; findings below may reference images not displayed]

FINDINGS: Brain: There is no acute infarction or intracranial hemorrhage.
There is no intracranial mass, mass effect, or edema. There is no
hydrocephalus or extra-axial fluid collection. Ventricles and sulci
are normal in size and configuration. No abnormal enhancement.

Vascular: Major vessel flow voids at the skull base are preserved.

Skull and upper cervical spine: Normal marrow signal is preserved.

Sinuses/Orbits: Minor mucosal thickening.  Orbits are unremarkable.

Other: Sella is unremarkable. Mastoid air cells are clear.
Incidental small retention or Tornwaldt cyst the posterior
nasopharyngeal wall.
IMPRESSION: No significant abnormality.
